# Patient Record
Sex: Male | Born: 1960 | Race: Black or African American | Hispanic: No | Marital: Single | Smoking: Current every day smoker
Health system: Southern US, Community
[De-identification: ages and names within clinical notes are randomized; demographics above are authoritative.]

## PROBLEM LIST (undated history)

## (undated) DIAGNOSIS — Z8739 Personal history of other diseases of the musculoskeletal system and connective tissue: Secondary | ICD-10-CM

## (undated) DIAGNOSIS — J45909 Unspecified asthma, uncomplicated: Secondary | ICD-10-CM

## (undated) DIAGNOSIS — M1990 Unspecified osteoarthritis, unspecified site: Secondary | ICD-10-CM

## (undated) HISTORY — PX: EYE SURGERY: SHX253

---

## 2007-06-14 ENCOUNTER — Emergency Department: Payer: Self-pay | Admitting: Emergency Medicine

## 2013-08-04 ENCOUNTER — Emergency Department: Payer: Self-pay | Admitting: Emergency Medicine

## 2013-08-25 ENCOUNTER — Ambulatory Visit: Payer: Self-pay | Admitting: Orthopedic Surgery

## 2014-01-18 ENCOUNTER — Emergency Department: Payer: Self-pay | Admitting: Emergency Medicine

## 2014-01-18 LAB — COMPREHENSIVE METABOLIC PANEL
ALK PHOS: 65 U/L
AST: 12 U/L — AB (ref 15–37)
Albumin: 4.3 g/dL (ref 3.4–5.0)
Anion Gap: 7 (ref 7–16)
BUN: 16 mg/dL (ref 7–18)
Bilirubin,Total: 0.6 mg/dL (ref 0.2–1.0)
Calcium, Total: 9.4 mg/dL (ref 8.5–10.1)
Chloride: 105 mmol/L (ref 98–107)
Co2: 25 mmol/L (ref 21–32)
Creatinine: 1.01 mg/dL (ref 0.60–1.30)
EGFR (African American): >60
EGFR (Non-African Amer.): >60
GLUCOSE: 124 mg/dL — AB (ref 65–99)
OSMOLALITY: 276 (ref 275–301)
POTASSIUM: 3.4 mmol/L — AB (ref 3.5–5.1)
SGPT (ALT): 28 U/L (ref 12–78)
SODIUM: 137 mmol/L (ref 136–145)
Total Protein: 7.8 g/dL (ref 6.4–8.2)

## 2014-01-18 LAB — CBC WITH DIFFERENTIAL/PLATELET
BASOS ABS: 0.1 10*3/uL (ref 0.0–0.1)
BASOS PCT: 0.4 %
EOS ABS: 0 10*3/uL (ref 0.0–0.7)
Eosinophil %: 0 %
HCT: 51.4 % (ref 40.0–52.0)
HGB: 16.8 g/dL (ref 13.0–18.0)
LYMPHS ABS: 0.2 10*3/uL — AB (ref 1.0–3.6)
Lymphocyte %: 1.4 %
MCH: 27.9 pg (ref 26.0–34.0)
MCHC: 32.7 g/dL (ref 32.0–36.0)
MCV: 86 fL (ref 80–100)
Monocyte #: 0.6 x10 3/mm (ref 0.2–1.0)
Monocyte %: 3.3 %
NEUTROS ABS: 16.1 10*3/uL — AB (ref 1.4–6.5)
Neutrophil %: 94.9 %
Platelet: 232 10*3/uL (ref 150–440)
RBC: 6.01 10*6/uL — ABNORMAL HIGH (ref 4.40–5.90)
RDW: 15.3 % — ABNORMAL HIGH (ref 11.5–14.5)
WBC: 17 10*3/uL — AB (ref 3.8–10.6)

## 2014-01-18 LAB — LIPASE, BLOOD: Lipase: 85 U/L (ref 73–393)

## 2014-01-18 LAB — TROPONIN I

## 2014-05-31 ENCOUNTER — Emergency Department: Payer: Self-pay | Admitting: Emergency Medicine

## 2014-05-31 LAB — URINALYSIS, COMPLETE
BILIRUBIN, UR: NEGATIVE
Blood: NEGATIVE
Glucose,UR: NEGATIVE mg/dL (ref 0–75)
Nitrite: NEGATIVE
PH: 7 (ref 4.5–8.0)
Protein: 30
RBC,UR: 2 /HPF (ref 0–5)
Specific Gravity: 1.027 (ref 1.003–1.030)
Squamous Epithelial: NONE SEEN

## 2014-05-31 LAB — CBC WITH DIFFERENTIAL/PLATELET
BASOS ABS: 0 10*3/uL (ref 0.0–0.1)
BASOS PCT: 0.1 %
EOS PCT: 0 %
Eosinophil #: 0 10*3/uL (ref 0.0–0.7)
HCT: 48.6 % (ref 40.0–52.0)
HGB: 16.1 g/dL (ref 13.0–18.0)
LYMPHS ABS: 1 10*3/uL (ref 1.0–3.6)
LYMPHS PCT: 7 %
MCH: 28.3 pg (ref 26.0–34.0)
MCHC: 33.2 g/dL (ref 32.0–36.0)
MCV: 85 fL (ref 80–100)
Monocyte #: 1.2 x10 3/mm — ABNORMAL HIGH (ref 0.2–1.0)
Monocyte %: 8.1 %
NEUTROS PCT: 84.8 %
Neutrophil #: 12.2 10*3/uL — ABNORMAL HIGH (ref 1.4–6.5)
Platelet: 241 10*3/uL (ref 150–440)
RBC: 5.68 10*6/uL (ref 4.40–5.90)
RDW: 14.9 % — ABNORMAL HIGH (ref 11.5–14.5)
WBC: 14.4 10*3/uL — ABNORMAL HIGH (ref 3.8–10.6)

## 2014-05-31 LAB — COMPREHENSIVE METABOLIC PANEL
ALBUMIN: 4.1 g/dL (ref 3.4–5.0)
AST: 32 U/L (ref 15–37)
Alkaline Phosphatase: 71 U/L
Anion Gap: 7 (ref 7–16)
BILIRUBIN TOTAL: 0.9 mg/dL (ref 0.2–1.0)
BUN: 8 mg/dL (ref 7–18)
CALCIUM: 9.8 mg/dL (ref 8.5–10.1)
CREATININE: 1.14 mg/dL (ref 0.60–1.30)
Chloride: 106 mmol/L (ref 98–107)
Co2: 25 mmol/L (ref 21–32)
GLUCOSE: 109 mg/dL — AB (ref 65–99)
Osmolality: 275 (ref 275–301)
Potassium: 4.2 mmol/L (ref 3.5–5.1)
SGPT (ALT): 31 U/L (ref 12–78)
SODIUM: 138 mmol/L (ref 136–145)
Total Protein: 7.6 g/dL (ref 6.4–8.2)

## 2014-05-31 LAB — VALPROIC ACID LEVEL: VALPROIC ACID: 4 ug/mL — AB

## 2014-10-15 ENCOUNTER — Emergency Department: Payer: Self-pay | Admitting: Internal Medicine

## 2014-10-15 LAB — COMPREHENSIVE METABOLIC PANEL
ALK PHOS: 60 U/L
Albumin: 4 g/dL (ref 3.4–5.0)
Anion Gap: 8 (ref 7–16)
BILIRUBIN TOTAL: 0.5 mg/dL (ref 0.2–1.0)
BUN: 13 mg/dL (ref 7–18)
CALCIUM: 8.9 mg/dL (ref 8.5–10.1)
CREATININE: 1.15 mg/dL (ref 0.60–1.30)
Chloride: 105 mmol/L (ref 98–107)
Co2: 27 mmol/L (ref 21–32)
EGFR (African American): >60
Glucose: 116 mg/dL — ABNORMAL HIGH (ref 65–99)
OSMOLALITY: 280 (ref 275–301)
Potassium: 4.3 mmol/L (ref 3.5–5.1)
SGOT(AST): 21 U/L (ref 15–37)
SGPT (ALT): 29 U/L
SODIUM: 140 mmol/L (ref 136–145)
Total Protein: 7.2 g/dL (ref 6.4–8.2)

## 2014-10-15 LAB — CBC
HCT: 46.3 % (ref 40.0–52.0)
HGB: 15.5 g/dL (ref 13.0–18.0)
MCH: 28.1 pg (ref 26.0–34.0)
MCHC: 33.4 g/dL (ref 32.0–36.0)
MCV: 84 fL (ref 80–100)
PLATELETS: 212 10*3/uL (ref 150–440)
RBC: 5.51 10*6/uL (ref 4.40–5.90)
RDW: 15.2 % — ABNORMAL HIGH (ref 11.5–14.5)
WBC: 10.6 10*3/uL (ref 3.8–10.6)

## 2014-10-15 LAB — ACETAMINOPHEN LEVEL: Acetaminophen: <2 ug/mL

## 2014-10-15 LAB — TROPONIN I: Troponin-I: <0.02 ng/mL

## 2014-10-15 LAB — SALICYLATE LEVEL: Salicylates, Serum: 2.5 mg/dL

## 2014-10-15 LAB — ETHANOL: Ethanol: 6 mg/dL

## 2016-06-04 ENCOUNTER — Telehealth: Payer: Self-pay | Admitting: *Deleted

## 2016-06-04 NOTE — Telephone Encounter (Signed)
lvm making the patient aware that I need insurance info in order to get his referral reviewed. i made him aware that the insurance that's given is stating ineligible..TD

## 2016-10-09 ENCOUNTER — Inpatient Hospital Stay: Payer: Medicaid Other

## 2016-10-09 ENCOUNTER — Inpatient Hospital Stay: Payer: Medicaid Other | Admitting: Certified Registered"

## 2016-10-09 ENCOUNTER — Inpatient Hospital Stay
Admission: EM | Admit: 2016-10-09 | Discharge: 2016-10-10 | DRG: 384 | Disposition: A | Discharge status: Home / Self Care | Payer: Medicaid Other | Attending: Internal Medicine | Admitting: Internal Medicine

## 2016-10-09 ENCOUNTER — Encounter
Admission: EM | Disposition: A | Discharge status: Home / Self Care | Payer: Self-pay | Source: Home / Self Care | Attending: Internal Medicine

## 2016-10-09 ENCOUNTER — Encounter: Payer: Self-pay | Admitting: Emergency Medicine

## 2016-10-09 DIAGNOSIS — Z23 Encounter for immunization: Secondary | ICD-10-CM

## 2016-10-09 DIAGNOSIS — F17210 Nicotine dependence, cigarettes, uncomplicated: Secondary | ICD-10-CM | POA: Diagnosis present

## 2016-10-09 DIAGNOSIS — R1084 Generalized abdominal pain: Secondary | ICD-10-CM | POA: Diagnosis present

## 2016-10-09 DIAGNOSIS — K269 Duodenal ulcer, unspecified as acute or chronic, without hemorrhage or perforation: Principal | ICD-10-CM | POA: Diagnosis present

## 2016-10-09 DIAGNOSIS — F419 Anxiety disorder, unspecified: Secondary | ICD-10-CM | POA: Diagnosis present

## 2016-10-09 DIAGNOSIS — R109 Unspecified abdominal pain: Secondary | ICD-10-CM

## 2016-10-09 DIAGNOSIS — K089 Disorder of teeth and supporting structures, unspecified: Secondary | ICD-10-CM | POA: Diagnosis present

## 2016-10-09 DIAGNOSIS — K209 Esophagitis, unspecified: Secondary | ICD-10-CM | POA: Diagnosis present

## 2016-10-09 DIAGNOSIS — R7989 Other specified abnormal findings of blood chemistry: Secondary | ICD-10-CM | POA: Diagnosis present

## 2016-10-09 DIAGNOSIS — M545 Low back pain: Secondary | ICD-10-CM | POA: Diagnosis present

## 2016-10-09 DIAGNOSIS — M1990 Unspecified osteoarthritis, unspecified site: Secondary | ICD-10-CM | POA: Diagnosis present

## 2016-10-09 DIAGNOSIS — N179 Acute kidney failure, unspecified: Secondary | ICD-10-CM | POA: Diagnosis present

## 2016-10-09 DIAGNOSIS — K922 Gastrointestinal hemorrhage, unspecified: Secondary | ICD-10-CM

## 2016-10-09 DIAGNOSIS — K920 Hematemesis: Secondary | ICD-10-CM | POA: Diagnosis present

## 2016-10-09 DIAGNOSIS — G4700 Insomnia, unspecified: Secondary | ICD-10-CM | POA: Diagnosis present

## 2016-10-09 HISTORY — DX: Unspecified osteoarthritis, unspecified site: M19.90

## 2016-10-09 HISTORY — PX: ESOPHAGOGASTRODUODENOSCOPY (EGD) WITH PROPOFOL: SHX5813

## 2016-10-09 LAB — COMPREHENSIVE METABOLIC PANEL
ALBUMIN: 4.3 g/dL (ref 3.5–5.0)
ALK PHOS: 60 U/L (ref 38–126)
ALT: 36 U/L (ref 17–63)
AST: 33 U/L (ref 15–41)
Anion gap: 13 (ref 5–15)
BUN: 18 mg/dL (ref 6–20)
CALCIUM: 9.5 mg/dL (ref 8.9–10.3)
CO2: 25 mmol/L (ref 22–32)
CREATININE: 1.29 mg/dL — AB (ref 0.61–1.24)
Chloride: 98 mmol/L — ABNORMAL LOW (ref 101–111)
GFR calc Af Amer: >60 mL/min (ref >60)
GFR calc non Af Amer: >60 mL/min (ref >60)
GLUCOSE: 115 mg/dL — AB (ref 65–99)
Potassium: 4 mmol/L (ref 3.5–5.1)
SODIUM: 136 mmol/L (ref 135–145)
Total Bilirubin: 1.6 mg/dL — ABNORMAL HIGH (ref 0.3–1.2)
Total Protein: 7.2 g/dL (ref 6.5–8.1)

## 2016-10-09 LAB — HEMOGLOBIN AND HEMATOCRIT, BLOOD
HCT: 48.9 % (ref 40.0–52.0)
HEMATOCRIT: 45.9 % (ref 40.0–52.0)
Hemoglobin: 16.5 g/dL (ref 13.0–18.0)
Hemoglobin: 15.3 g/dL (ref 13.0–18.0)

## 2016-10-09 LAB — TYPE AND SCREEN
ABO/RH(D): AB POS
Antibody Screen: NEGATIVE

## 2016-10-09 LAB — CBC
HCT: 47.3 % (ref 40.0–52.0)
HEMOGLOBIN: 16.2 g/dL (ref 13.0–18.0)
MCH: 27.5 pg (ref 26.0–34.0)
MCHC: 34.2 g/dL (ref 32.0–36.0)
MCV: 80.4 fL (ref 80.0–100.0)
PLATELETS: 260 10*3/uL (ref 150–440)
RBC: 5.88 MIL/uL (ref 4.40–5.90)
RDW: 15.5 % — ABNORMAL HIGH (ref 11.5–14.5)
WBC: 11.7 10*3/uL — ABNORMAL HIGH (ref 3.8–10.6)

## 2016-10-09 LAB — LIPASE, BLOOD: Lipase: 17 U/L (ref 11–51)

## 2016-10-09 SURGERY — ESOPHAGOGASTRODUODENOSCOPY (EGD) WITH PROPOFOL
Anesthesia: General

## 2016-10-09 MED ORDER — QUETIAPINE FUMARATE 300 MG PO TABS
600.0000 mg | ORAL_TABLET | Freq: Every day | ORAL | Status: DC
  Administered 2016-10-09: 600 mg via ORAL
  Filled 2016-10-09: qty 2

## 2016-10-09 MED ORDER — PANTOPRAZOLE SODIUM 40 MG IV SOLR
40.0000 mg | Freq: Once | INTRAVENOUS | Status: AC
  Administered 2016-10-09: 40 mg via INTRAVENOUS
  Filled 2016-10-09: qty 40

## 2016-10-09 MED ORDER — ONDANSETRON HCL 4 MG PO TABS: 4.0000 mg | ORAL_TABLET | Freq: Four times a day (QID) | ORAL | Status: DC | PRN

## 2016-10-09 MED ORDER — OXYCODONE HCL 5 MG PO TABS
5.0000 mg | ORAL_TABLET | ORAL | Status: DC | PRN
  Administered 2016-10-09 (×2): 5 mg via ORAL
  Filled 2016-10-09 (×2): qty 1

## 2016-10-09 MED ORDER — ONDANSETRON HCL 4 MG/2ML IJ SOLN: 4.0000 mg | Freq: Four times a day (QID) | INTRAMUSCULAR | Status: DC | PRN

## 2016-10-09 MED ORDER — PROPOFOL 500 MG/50ML IV EMUL
INTRAVENOUS | Status: DC | PRN
  Administered 2016-10-09: 120 ug/kg/min via INTRAVENOUS

## 2016-10-09 MED ORDER — SODIUM CHLORIDE 0.9% FLUSH
3.0000 mL | Freq: Two times a day (BID) | INTRAVENOUS | Status: DC
  Administered 2016-10-09 (×2): 3 mL via INTRAVENOUS

## 2016-10-09 MED ORDER — INFLUENZA VAC SPLIT QUAD 0.5 ML IM SUSY
0.5000 mL | PREFILLED_SYRINGE | INTRAMUSCULAR | Status: AC
  Administered 2016-10-10: 0.5 mL via INTRAMUSCULAR
  Filled 2016-10-09: qty 0.5

## 2016-10-09 MED ORDER — PNEUMOCOCCAL VAC POLYVALENT 25 MCG/0.5ML IJ INJ
0.5000 mL | INJECTION | INTRAMUSCULAR | Status: AC
  Administered 2016-10-10: 0.5 mL via INTRAMUSCULAR
  Filled 2016-10-09: qty 0.5

## 2016-10-09 MED ORDER — MIRTAZAPINE 15 MG PO TABS
45.0000 mg | ORAL_TABLET | Freq: Every day | ORAL | Status: DC
  Administered 2016-10-09: 45 mg via ORAL
  Filled 2016-10-09: qty 3

## 2016-10-09 MED ORDER — ACETAMINOPHEN 650 MG RE SUPP: 650.0000 mg | Freq: Four times a day (QID) | RECTAL | Status: DC | PRN

## 2016-10-09 MED ORDER — DEXTROSE-NACL 5-0.9 % IV SOLN
INTRAVENOUS | Status: DC
  Administered 2016-10-09 – 2016-10-10 (×2): via INTRAVENOUS

## 2016-10-09 MED ORDER — SODIUM CHLORIDE 0.9 % IV SOLN
INTRAVENOUS | Status: DC | PRN
  Administered 2016-10-09: 16:00:00 via INTRAVENOUS

## 2016-10-09 MED ORDER — LIDOCAINE 2% (20 MG/ML) 5 ML SYRINGE
INTRAMUSCULAR | Status: DC | PRN
  Administered 2016-10-09: 50 mg via INTRAVENOUS

## 2016-10-09 MED ORDER — GLYCOPYRROLATE 0.2 MG/ML IJ SOLN
INTRAMUSCULAR | Status: DC | PRN
  Administered 2016-10-09: 0.2 mg via INTRAVENOUS

## 2016-10-09 MED ORDER — ACETAMINOPHEN 325 MG PO TABS: 650.0000 mg | ORAL_TABLET | Freq: Four times a day (QID) | ORAL | Status: DC | PRN

## 2016-10-09 MED ORDER — PROPOFOL 10 MG/ML IV BOLUS
INTRAVENOUS | Status: DC | PRN
  Administered 2016-10-09: 70 mg via INTRAVENOUS
  Administered 2016-10-09: 30 mg via INTRAVENOUS

## 2016-10-09 MED ORDER — PANTOPRAZOLE SODIUM 40 MG IV SOLR
8.0000 mg/h | INTRAVENOUS | Status: DC
Start: 2016-10-09 — End: 2016-10-10
  Administered 2016-10-09 – 2016-10-10 (×2): 8 mg/h via INTRAVENOUS
  Filled 2016-10-09 (×3): qty 80

## 2016-10-09 MED ORDER — MORPHINE SULFATE (PF) 2 MG/ML IV SOLN
2.0000 mg | Freq: Four times a day (QID) | INTRAVENOUS | Status: DC | PRN
  Administered 2016-10-09: 2 mg via INTRAVENOUS
  Filled 2016-10-09: qty 1

## 2016-10-09 MED ORDER — MIDAZOLAM HCL 5 MG/5ML IJ SOLN
INTRAMUSCULAR | Status: DC | PRN
  Administered 2016-10-09: 1 mg via INTRAVENOUS

## 2016-10-09 MED ORDER — PANTOPRAZOLE SODIUM 40 MG IV SOLR
80.0000 mg | Freq: Once | INTRAVENOUS | Status: AC
  Administered 2016-10-09: 80 mg via INTRAVENOUS
  Filled 2016-10-09: qty 80

## 2016-10-09 MED ORDER — FENTANYL CITRATE (PF) 100 MCG/2ML IJ SOLN
INTRAMUSCULAR | Status: DC | PRN
  Administered 2016-10-09: 50 ug via INTRAVENOUS

## 2016-10-09 NOTE — ED Provider Notes (Signed)
[facility] Emergency Department Provider Note  ____________________________________________  Time seen: Approximately 11:33 AM  I have reviewed the triage vital signs and the nursing notes.   HISTORY  Chief Complaint Abdominal Pain    HPI Duane ClauseColonel R Berko Jr. is a 55 y.o. male who complains of abdominal pain for the past month which is generalized and constant. Waxing and waning. Also last night he started throwing up blood. He threw up blood again this morning for total 2 episodes. He is also noticed that his stool has been very dark recently. Does not take aspirin blood thinners or eat a lot of NSAIDs.No history of GI bleed     Past Medical History:  Diagnosis Date  . Arthritis      There are no active problems to display for this patient.    Past Surgical History:  Procedure Laterality Date  . EYE SURGERY       Prior to Admission medications   Not on File     Allergies Review of patient's allergies indicates no known allergies.   No family history on file.  Social History Social History  Substance Use Topics  . Smoking status: Current Some Day Smoker  . Smokeless tobacco: Never Used  . Alcohol use No    Review of Systems  Constitutional:   No fever or chills.  ENT:   No sore throat. No rhinorrhea. Cardiovascular:   No chest pain. Respiratory:   No dyspnea or cough. Gastrointestinal:   Positive as above for generalized abdominal pain and hematemesis.   10-point ROS otherwise negative.  ____________________________________________   PHYSICAL EXAM:  VITAL SIGNS: ED Triage Vitals  Enc Vitals Group     BP 10/09/16 0930 (!) 148/80     Pulse Rate 10/09/16 0936 65     Resp 10/09/16 0930 15     Temp 10/09/16 0936 98.4 F (36.9 C)     Temp Source 10/09/16 0936 Oral     SpO2 10/09/16 0936 99 %     Weight 10/09/16 0921 179 lb 3.7 oz (81.3 kg)     Height 10/09/16 0921 5\' 7"  (1.702 m)     Head Circumference --      Peak  Flow --      Pain Score 10/09/16 0922 10     Pain Loc --      Pain Edu? --      Excl. in GC? --     Vital signs reviewed, nursing assessments reviewed.   Constitutional:   Alert and oriented. Well appearing and in no distress. Eyes:   No scleral icterus. No conjunctival pallor. PERRL. EOMI.  No nystagmus. ENT   Head:   Normocephalic and atraumatic.   Nose:   No congestion/rhinnorhea. No septal hematoma   Mouth/Throat:   MMM, no pharyngeal erythema. No peritonsillar mass.    Neck:   No stridor. No SubQ emphysema. No meningismus. Hematological/Lymphatic/Immunilogical:   No cervical lymphadenopathy. Cardiovascular:   RRR. Symmetric bilateral radial and DP pulses.  No murmurs.  Respiratory:   Normal respiratory effort without tachypnea nor retractions. Breath sounds are clear and equal bilaterally. No wheezes/rales/rhonchi. Gastrointestinal:   Soft with epigastric tenderness. Non distended. There is no CVA tenderness.  No rebound, rigidity, or guarding. Rectal exam performed with nurse at the bedside. Dark stool, Hemoccult positive Musculoskeletal:   Nontender with normal range of motion in all extremities. No joint effusions.  No lower extremity tenderness.  No edema. Neurologic:   Normal speech and language.  CN 2-10 normal. Motor grossly intact. No gross focal neurologic deficits are appreciated.  Skin:    Skin is warm, dry and intact. No rash noted.  No petechiae, purpura, or bullae.  ____________________________________________    LABS (pertinent positives/negatives) (all labs ordered are listed, but only abnormal results are displayed) Labs Reviewed  COMPREHENSIVE METABOLIC PANEL - Abnormal; Notable for the following:       Result Value   Chloride 98 (*)    Glucose, Bld 115 (*)    Creatinine, Ser 1.29 (*)    Total Bilirubin 1.6 (*)    All other components within normal limits  CBC - Abnormal; Notable for the following:    WBC 11.7 (*)    RDW 15.5 (*)    All  other components within normal limits  LIPASE, BLOOD  POC OCCULT BLOOD, ED  TYPE AND SCREEN   ____________________________________________   EKG  Interpreted by me  Date: 10/09/2016  Rate: 75  Rhythm: normal sinus rhythm  QRS Axis: normal  Intervals: normal  ST/T Wave abnormalities: normal  Conduction Disutrbances: none  Narrative Interpretation: unremarkable      ____________________________________________    RADIOLOGY    ____________________________________________   PROCEDURES Procedures  ____________________________________________   INITIAL IMPRESSION / ASSESSMENT AND PLAN / ED COURSE  Pertinent labs & imaging results that were available during my care of the patient were reviewed by me and considered in my medical decision making (see chart for details).  Patient not in distress but presents with apparent upper GI bleed with hematemesis. Due to the chronicity and the presentation, I suspect peptic ulcer disease. No evidence of peritonitis or GI perforation at this time. Hemoglobin is stable, vital signs are stable. Case discussed with the hospitalists for further evaluation.     Clinical Course   ____________________________________________   FINAL CLINICAL IMPRESSION(S) / ED DIAGNOSES  Final diagnoses:  Generalized abdominal pain  Upper GI bleed       Portions of this note were generated with dragon dictation software. Dictation errors may occur despite best attempts at proofreading.    Sharman CheekPhillip Nathali Vent, MD 10/09/16 1136

## 2016-10-09 NOTE — Op Note (Signed)
[facility] Gastroenterology Patient Name: Duane DuboisColonel Solecki Procedure Date: 10/09/2016 4:14 PM MRN: [id number] Account #: [id number] Date of Birth: [birth year] Admit Type: Outpatient Age: [age] Room: [facility] ENDO ROOM 4 Gender: Male Note Status: Finalized Procedure:            Upper GI endoscopy Indications:          Epigastric abdominal pain, Periumbilical abdominal pain Providers:            Scot Junobert T. Elliott, MD Referring MD:         [facility] Medicines:            Propofol per Anesthesia Complications:        No immediate complications. Procedure:            Pre-Anesthesia Assessment:                       - After reviewing the risks and benefits, the patient                        was deemed in satisfactory condition to undergo the                        procedure.                       After obtaining informed consent, the endoscope was                        passed under direct vision. Throughout the procedure,                        the patient's blood pressure, pulse, and oxygen                        saturations were monitored continuously. The Endoscope                        was introduced through the mouth, and advanced to the                        second part of duodenum. The upper GI endoscopy was                        accomplished without difficulty. The patient tolerated                        the procedure well. Findings:      LA Grade B (one or more mucosal breaks greater than 5 mm, not extending       between the tops of two mucosal folds) esophagitis with no bleeding was       found 40 cm from the incisors.      Diffuse mild inflammation characterized by congestion (edema) and       erythema was found in the gastric body.      Five non-bleeding cratered and superficial duodenal ulcers with no       stigmata of bleeding were found in the duodenal bulb and in the second       portion of the duodenum. The largest lesion was 4 mm in  largest       dimension. Impression:           -  LA Grade B reflux esophagitis.                       - Gastritis.                       - Multiple non-bleeding duodenal ulcers with no                        stigmata of bleeding.                       - No specimens collected. Recommendation:       - The findings and recommendations were discussed with                        the patient's primary physician. IV PPI and carafate                        slurry, clear liquids, no carbonation, no coffee Scot Junobert T Elliott, MD 10/09/2016 4:44:16 PM This report has been signed electronically. Number of Addenda: 0 Note Initiated On: 10/09/2016 4:14 PM      [facility]

## 2016-10-09 NOTE — ED Notes (Signed)
Hospitalist at pt's bedside at this time for admit.

## 2016-10-09 NOTE — Anesthesia Preprocedure Evaluation (Signed)
Anesthesia Evaluation  Patient identified by MRN, date of birth, ID band Patient awake    Reviewed: Allergy & Precautions, NPO status , Patient's Chart, lab work & pertinent test results  History of Anesthesia Complications Negative for: history of anesthetic complications  Airway Mallampati: II       Dental  (+) Poor Dentition   Pulmonary Current Smoker,           Cardiovascular negative cardio ROS       Neuro/Psych negative neurological ROS     GI/Hepatic negative GI ROS, Neg liver ROS,   Endo/Other  negative endocrine ROS  Renal/GU negative Renal ROS     Musculoskeletal  (+) Arthritis , Osteoarthritis,    Abdominal   Peds  Hematology negative hematology ROS (+)   Anesthesia Other Findings   Reproductive/Obstetrics                             Anesthesia Physical Anesthesia Plan  ASA: II  Anesthesia Plan: General   Post-op Pain Management:    Induction: Intravenous  Airway Management Planned: Nasal Cannula  Additional Equipment:   Intra-op Plan:   Post-operative Plan:   Informed Consent: I have reviewed the patients History and Physical, chart, labs and discussed the procedure including the risks, benefits and alternatives for the proposed anesthesia with the patient or authorized representative who has indicated his/her understanding and acceptance.     Plan Discussed with:   Anesthesia Plan Comments:         Anesthesia Quick Evaluation

## 2016-10-09 NOTE — Progress Notes (Signed)
Family Meeting Note  Advance Directive:yes  Today a meeting took place with the Patient and sister over phone  Patient is able to participate  The following clinical team members were present during this meeting:MD  The following were discussed:Patient's diagnosis: , Patient's progosis: > 12 months and Goals for treatment: Continue present management  Full code, sister Jasmine DecemberSharon is the healthcare power of attorney  Additional follow-up to be provided: By hospitalist team and gastroenterology  Time spent during discussion:15 minutes  Shavon Ashmore, Deanna ArtisAruna, MD

## 2016-10-09 NOTE — Anesthesia Postprocedure Evaluation (Signed)
Anesthesia Post Note  Patient: Duane ClauseColonel R Kersten Jr.  Procedure(s) Performed: Procedure(s) (LRB): ESOPHAGOGASTRODUODENOSCOPY (EGD) WITH PROPOFOL (N/A)  Patient location during evaluation: Endoscopy Anesthesia Type: General Level of consciousness: awake and alert Pain management: pain level controlled Vital Signs Assessment: post-procedure vital signs reviewed and stable Respiratory status: spontaneous breathing and respiratory function stable Cardiovascular status: stable Anesthetic complications: no    Last Vitals:  Vitals:   10/09/16 1715 10/09/16 1733  BP: (!) 167/100 (!) 159/82  Pulse: 67 67  Resp: 17 16  Temp:  36.7 C    Last Pain:  Vitals:   10/09/16 1733  TempSrc: Oral  PainSc:                  [name] K

## 2016-10-09 NOTE — ED Notes (Signed)
Pt had pain in abdomen that began about 1 month ago. Pt stating that he vomited yesterday and noticed blood in is emesis. Pt also stating that he has noticed his stool being darker. Pt has been also taking Pepto-bismal. Per Marylene LandAngela, RN pt did have a positive occult of stool.

## 2016-10-09 NOTE — Consult Note (Signed)
GI Inpatient Consult Note  Reason for Consult: GI bleed   Attending Requesting Consult: Dr. Amado CoeGouru  History of Present Illness: Duane ClauseColonel R Haskett Jr. is a 55 y.o. male with a known history of arthritis admitted with a suspected GI bleed. Patient reports intermittent abdominal pain and "dark stools" over the last month or so.  Prior to the last month he experienced intermittent stomach discomfort, gas, and indigestion, but states "I thought this was just normal stuff".  He tried OTCs like Pepto-Bismol, Gas-X, and Prilosec without long-term improvement.  Regarding stools, patient reports "dark stools with red blood mixed in".  He continues to have a soft BM every 1-2 days w/o recent change.  Last night, abdominal pain increased significantly after eating dinner.  He became nauseous and experienced 2 episodes of dark emesis with bright red blood intermixed.   This morning, patient experienced another episode of dark emesis with BRB intermixed, so he called EMS.  He was transported to [facility] ED for further evaluation.  Upon arrival, vital signs are stable.  Labs demonstrated mild leukocytosis at 11.7, otherwise CBC unremarkable; this includes a normal Hgb of 16.2.  CMP notable for mildly elevated creatinine (1.29), BUN WNL.  Lipase and salicylate level also WNL.  However, black stool returned heme positive on rectal exam in the ED.  Patient was admitted for further evaluation and management, including initiation of IV Protonix drip and GI consultation.  Patient endorses a history of knee and low back pain, but denies use of NSAIDs, Goody's, BCs, etc.  He smokes about 1/2 pack of cigarettes daily, but denies EtOH use.  No prior abdominal surgeries.  No unexplained weight loss or appetite changes.  No dysphagia, heartburn, or acid reflux.  No recent change in bowel habits or frank blood in stool.  No personal h/o GI bleed.  Patient denies a family history of CRC, colon polyps, or other GI malignancy.  He has  never underwent an EGD or colonoscopy.  Past Medical History:  Past Medical History:  Diagnosis Date  . Arthritis     Problem List: Patient Active Problem List   Diagnosis Date Noted  . Hematemesis 10/09/2016    Past Surgical History: Past Surgical History:  Procedure Laterality Date  . EYE SURGERY      Allergies: No Known Allergies  Home Medications: Prescriptions Prior to Admission  Medication Sig Dispense Refill Last Dose  . mirtazapine (REMERON) 45 MG tablet Take 45 mg by mouth at bedtime.   10/08/2016 at 2000  . QUEtiapine (SEROQUEL) 300 MG tablet Take 600 mg by mouth at bedtime.   10/08/2016 at 2000   Home medication reconciliation was completed with the patient.   Scheduled Inpatient Medications:   . [START ON 10/10/2016] Influenza vac split quadrivalent PF  0.5 mL Intramuscular Tomorrow-1000  . mirtazapine  45 mg Oral QHS  . pantoprazole (PROTONIX) IVPB  80 mg Intravenous Once  . [START ON 10/10/2016] pneumococcal 23 valent vaccine  0.5 mL Intramuscular Tomorrow-1000  . QUEtiapine  600 mg Oral QHS  . sodium chloride flush  3 mL Intravenous Q12H    Continuous Inpatient Infusions:   . dextrose 5 % and 0.9% NaCl    . pantoprozole (PROTONIX) infusion      PRN Inpatient Medications:  acetaminophen **OR** acetaminophen, morphine injection, ondansetron **OR** ondansetron (ZOFRAN) IV, oxyCODONE  Family History: family history is not on file.   Social History:   reports that he has been smoking.  He has never used smokeless  tobacco. He reports that he does not drink alcohol.   Review of Systems: Constitutional: Weight is stable.  Eyes: No changes in vision. ENT: No oral lesions, sore throat.  GI: see HPI.  Heme/Lymph: No easy bruising.  CV: No chest pain.  GU: No hematuria.  Integumentary: No rashes.  Neuro: No headaches.  Psych: No depression/anxiety.  Endocrine: No heat/cold intolerance.  Allergic/Immunologic: No urticaria.  Resp: No cough, SOB.   Musculoskeletal: No joint swelling.    Physical Examination: BP (!) 161/77 (BP Location: Right Arm)   Pulse 60   Temp 98.2 F (36.8 C) (Oral)   Resp 18   Ht 5\' 7"  (1.702 m)   Wt 81.3 kg (179 lb 3.7 oz)   SpO2 98%   BMI 28.07 kg/m  Gen: NAD, alert and oriented x 4 HEENT: PEERLA, EOMI, Neck: supple, no JVD or thyromegaly Chest: CTA bilaterally, no wheezes, crackles, or other adventitious sounds CV: RRR, no m/g/c/r Abd: soft, moderate periumbilical tenderness > generalized abdominal pain, ND, +BS in all four quadrants; no HSM, guarding, ridigity, or rebound tenderness Ext: no edema, well perfused with 2+ pulses, Skin: no rash or lesions noted Lymph: no LAD  Data: Lab Results  Component Value Date   WBC 11.7 (H) 10/09/2016   HGB 16.2 10/09/2016   HCT 47.3 10/09/2016   MCV 80.4 10/09/2016   PLT 260 10/09/2016    Recent Labs Lab 10/09/16 0926  HGB 16.2   Lab Results  Component Value Date   NA 136 10/09/2016   K 4.0 10/09/2016   CL 98 (L) 10/09/2016   CO2 25 10/09/2016   BUN 18 10/09/2016   CREATININE 1.29 (H) 10/09/2016   Lab Results  Component Value Date   ALT 36 10/09/2016   AST 33 10/09/2016   ALKPHOS 60 10/09/2016   BILITOT 1.6 (H) 10/09/2016   No results for input(s): APTT, INR, PTT in the last 168 hours.   Assessment/Plan: Duane Randolph is a 55 y.o. male with a known history of arthritis admitted with a suspected GI bleed. Patient's primary complaints include indigestion, periumbilical pain, and "dark and bloody stools" intermittently over the last month.  He also notes "dark and bloody" emesis x 3 since last night.  Vitals and Hgb are quite stable.  Patient's most bothered by periumbilical pain at this time, so we will obtain a KUB flat/upright to begin evaluating this.  We will also plan for an EGD tomorrow pending Dr. Earnest ConroyElliott's evaluation of the patient.  Notably, patient's last KUB was obtained due to reported swallowing of a bag of cocaine; therefore, will  obtain a UDS as well.  Further recs pending patient's progress and per Dr. Mechele CollinElliott.  Recommendations: - KUB flat/upright, UDS today - EGD tomorrow pending Dr. Earnest ConroyElliott's evaluation - Continue symptomatic measures including PPI drip, pain control, and anti-emetics prn  Thank you for the consult. We will follow along with you. Please call with questions or concerns.  Burman FreestoneMichelle C Franki Stemen, PA-C [facility] Phone: [phone] Pager: [phone]

## 2016-10-09 NOTE — H&P (Signed)
[facility] [place] at [facility]   PATIENT NAME: Barton DuboisColonel Scalf    MR#:  [id number]  DATE OF BIRTH:  07-Aug-1961  DATE OF ADMISSION:  10/09/2016  PRIMARY CARE PHYSICIAN: [facility]   REQUESTING/REFERRING PHYSICIAN: STAFFORD  CHIEF COMPLAINT:  VOMITING BLOOD  HISTORY OF PRESENT ILLNESS:  Barton DuboisColonel Broman  is a 55 y.o. male with a known history of Arthritis is presenting to the emergency department with a chief complaint of epigastric abdominal pain and hematemesis. Patient has been noticing black tarry stool for the past one month and started having bloody vomits since last night. So far he had 2 episodes of hematemesis. Reporting epigastric abdominal pain 10 out of 10. Patient does not take over-the-counter Goody powders or NSAID's. No past medical history of GI bleed.Hemoglobin is at around 15  PAST MEDICAL HISTORY:   Past Medical History:  Diagnosis Date  . Arthritis     PAST SURGICAL HISTOIRY:   Past Surgical History:  Procedure Laterality Date  . EYE SURGERY      SOCIAL HISTORY:   Social History  Substance Use Topics  . Smoking status: Current Some Day Smoker  . Smokeless tobacco: Never Used  . Alcohol use No    FAMILY HISTORY:  No family history on file.  DRUG ALLERGIES:  No Known Allergies  REVIEW OF SYSTEMS:  CONSTITUTIONAL: No fever, fatigue or weakness.  EYES: No blurred or double vision.  EARS, NOSE, AND THROAT: No tinnitus or ear pain.  RESPIRATORY: No cough, shortness of breath, wheezing or hemoptysis.  CARDIOVASCULAR: No chest pain, orthopnea, edema.  GASTROINTESTINAL: Reporting epigastric abdominal pain and vomiting of blood. Also reports black tarry stool for a month GENITOURINARY: No dysuria, hematuria.  ENDOCRINE: No polyuria, nocturia,  HEMATOLOGY: No anemia, easy bruising or bleeding SKIN: No rash or lesion. MUSCULOSKELETAL: No joint pain or arthritis.   NEUROLOGIC: No tingling, numbness,  weakness.  PSYCHIATRY: No anxiety or depression.   MEDICATIONS AT HOME:   Prior to Admission medications   Medication Sig Start Date End Date Taking? Authorizing Provider  mirtazapine (REMERON) 45 MG tablet Take 45 mg by mouth at bedtime.   Yes Historical Provider, MD  QUEtiapine (SEROQUEL) 300 MG tablet Take 600 mg by mouth at bedtime.   Yes Historical Provider, MD      VITAL SIGNS:  Blood pressure (!) 142/72, pulse 64, temperature 98.4 F (36.9 C), temperature source Oral, resp. rate 19, height 5\' 7"  (1.702 m), weight 81.3 kg (179 lb 3.7 oz), SpO2 98 %.  PHYSICAL EXAMINATION:  GENERAL:  55 y.o.-year-old patient lying in the bed with no acute distress.  EYES: Pupils equal, round, reactive to light and accommodation. No scleral icterus. Extraocular muscles intact.  HEENT: Head atraumatic, normocephalic. Oropharynx and nasopharynx clear.  NECK:  Supple, no jugular venous distention. No thyroid enlargement, no tenderness.  LUNGS: Normal breath sounds bilaterally, no wheezing, rales,rhonchi or crepitation. No use of accessory muscles of respiration.  CARDIOVASCULAR: S1, S2 normal. No murmurs, rubs, or gallops.  ABDOMEN: Soft, Positive epigastric tenderness but no rebound tenderness, nondistended. Bowel sounds present. No organomegaly or mass.  EXTREMITIES: No pedal edema, cyanosis, or clubbing.  NEUROLOGIC: Cranial nerves II through XII are intact. Muscle strength 5/5 in all extremities. Sensation intact. Gait not checked.  PSYCHIATRIC: The patient is alert and oriented x 3.  SKIN: No obvious rash, lesion, or ulcer.   LABORATORY PANEL:   CBC  Recent Labs Lab 10/09/16 0926  WBC 11.7*  HGB 16.2  HCT 47.3  PLT 260   ------------------------------------------------------------------------------------------------------------------  Chemistries   Recent Labs Lab 10/09/16 0926  NA 136  K 4.0  CL 98*  CO2 25  GLUCOSE 115*  BUN 18  CREATININE 1.29*  CALCIUM 9.5  AST 33   ALT 36  ALKPHOS 60  BILITOT 1.6*   ------------------------------------------------------------------------------------------------------------------  Cardiac Enzymes No results for input(s): TROPONINI in the last 168 hours. ------------------------------------------------------------------------------------------------------------------  RADIOLOGY:  No results found.  EKG:   Orders placed or performed during the hospital encounter of 10/09/16  . EKG 12-Lead  . EKG 12-Lead    IMPRESSION AND PLAN:   Barton DuboisColonel Nhan  is a 55 y.o. male with a known history of Arthritis is presenting to the emergency department with a chief complaint of epigastric abdominal pain and hematemesis. Patient has been noticing black tarry stool for the past one month and started having bloody vomits since last night. So far he had 2 episodes of hematemesis. Reporting epigastric abdominal pain 10 out of 10.  # Acute GI bleed and hematemesis and reporting black tarry stool-Could be from peptic ulcer disease Admit to MedSurg unit Nothing by mouth Provide IV fluids Started patient on Protonix drip Monitor hemoglobin and hematocrit closely and transfuse as needed GI consult is placed Patient's stool for Hemoccult was positive in the emergency department  #Acute kidney injury-Prerenal from vomiting Provide IV fluids Avoid nephrotoxins Monitor renal function closely  #Epigastric abdominal pain secondary to Possible peptic ulcer disease Pain medication as needed basis PPI drip  #Insomnia And anxiety Continue Seroquel home medication  DVT prophylaxis with SCDs   All the records are reviewed and case discussed with ED provider. Management plans discussed with the patient, Sister and they are in agreement.  CODE STATUS: FC  TOTAL TIME TAKING CARE OF THIS PATIENT: 45 minutes.   Note: This dictation was prepared with Dragon dictation along with smaller phrase technology. Any transcriptional errors  that result from this process are unintentional.  Ramonita LabGouru, Quintyn Dombek M.D on 10/09/2016 at 12:21 PM  Between 7am to 6pm - Pager - [phone]  After 6pm go to [email or web address] - password [email or web address] [facility]  [facility]  Office  [phone]  CC: Primary care physician; [facility]

## 2016-10-09 NOTE — ED Triage Notes (Signed)
[facility] for abdominal pain x 1 month. Patient reports that last night he had an episode of vomiting that appeared to have blood in it and also noted that his stool has been darker than normal. Patient also had 1 episode of vomiting this morning.

## 2016-10-09 NOTE — Consult Note (Signed)
Patient with abd pain and bleeding had multiple duodenal ulcers on EGD.  Some local mild esophagitis at GEJ.  Recommend carafate slurry and continue PPI, prn anti-nausea medicine.  Advance diet very slowly.  Should have colonoscopy and repeat EGD in few months.

## 2016-10-09 NOTE — Transfer of Care (Signed)
Immediate Anesthesia Transfer of Care Note  Patient: Mertie ClauseColonel R Snuffer Jr.  Procedure(s) Performed: Procedure(s): ESOPHAGOGASTRODUODENOSCOPY (EGD) WITH PROPOFOL (N/A)  Patient Location: Endoscopy Unit  Anesthesia Type:General  Level of Consciousness: awake  Airway & Oxygen Therapy: Patient Spontanous Breathing and Patient connected to nasal cannula oxygen  Post-op Assessment: Report given to RN and Post -op Vital signs reviewed and stable  Post vital signs: Reviewed  Last Vitals:  Vitals:   10/09/16 1644 10/09/16 1645  BP: 140/86 140/86  Pulse: 83 83  Resp: 16 15  Temp: 36.1 C 36.1 C    Last Pain:  Vitals:   10/09/16 1645  TempSrc: Tympanic  PainSc: Asleep      Patients Stated Pain Goal: 10 (10/09/16 1604)  Complications: No apparent anesthesia complications

## 2016-10-09 NOTE — Progress Notes (Signed)
Patient made a request to talk with a chaplain. When chaplain on call received the consult he responded. Patient shared with chaplain how he had issues with vomiting blood, which scared him and he called 911. Patient said that vomiting blood stopped, but his stomach was still hurt. Chaplain prayed with patient after having a long conversation.

## 2016-10-09 NOTE — ED Notes (Signed)
Pt notified that we are just waiting on his room being available. Pt is currently on his phone with his significant other and in NAD at this time.

## 2016-10-10 LAB — CBC
HCT: 42.2 % (ref 40.0–52.0)
Hemoglobin: 14.3 g/dL (ref 13.0–18.0)
MCH: 27.6 pg (ref 26.0–34.0)
MCHC: 34 g/dL (ref 32.0–36.0)
MCV: 81.2 fL (ref 80.0–100.0)
PLATELETS: 198 10*3/uL (ref 150–440)
RBC: 5.19 MIL/uL (ref 4.40–5.90)
RDW: 15.4 % — AB (ref 11.5–14.5)
WBC: 5 10*3/uL (ref 3.8–10.6)

## 2016-10-10 LAB — PROTIME-INR
INR: 1.01
PROTHROMBIN TIME: 13.3 s (ref 11.4–15.2)

## 2016-10-10 LAB — COMPREHENSIVE METABOLIC PANEL
ALBUMIN: 3.3 g/dL — AB (ref 3.5–5.0)
ALK PHOS: 48 U/L (ref 38–126)
ALT: 43 U/L (ref 17–63)
ANION GAP: 4 — AB (ref 5–15)
AST: 44 U/L — ABNORMAL HIGH (ref 15–41)
BILIRUBIN TOTAL: 0.9 mg/dL (ref 0.3–1.2)
BUN: 8 mg/dL (ref 6–20)
CALCIUM: 8.7 mg/dL — AB (ref 8.9–10.3)
CO2: 26 mmol/L (ref 22–32)
Chloride: 111 mmol/L (ref 101–111)
Creatinine, Ser: 0.94 mg/dL (ref 0.61–1.24)
GLUCOSE: 142 mg/dL — AB (ref 65–99)
POTASSIUM: 3.8 mmol/L (ref 3.5–5.1)
Sodium: 141 mmol/L (ref 135–145)
TOTAL PROTEIN: 5.4 g/dL — AB (ref 6.5–8.1)

## 2016-10-10 LAB — HEMOGLOBIN AND HEMATOCRIT, BLOOD
HCT: 43.3 % (ref 40.0–52.0)
HEMOGLOBIN: 14.3 g/dL (ref 13.0–18.0)

## 2016-10-10 LAB — URINE DRUG SCREEN, QUALITATIVE (ARMC ONLY)
AMPHETAMINES, UR SCREEN: NOT DETECTED
BENZODIAZEPINE, UR SCRN: POSITIVE — AB
Barbiturates, Ur Screen: NOT DETECTED
Cannabinoid 50 Ng, Ur ~~LOC~~: POSITIVE — AB
Cocaine Metabolite,Ur ~~LOC~~: POSITIVE — AB
MDMA (ECSTASY) UR SCREEN: NOT DETECTED
METHADONE SCREEN, URINE: NOT DETECTED
Opiate, Ur Screen: POSITIVE — AB
Phencyclidine (PCP) Ur S: NOT DETECTED
TRICYCLIC, UR SCREEN: POSITIVE — AB

## 2016-10-10 MED ORDER — SUCRALFATE 1 GM/10ML PO SUSP
1.0000 g | Freq: Three times a day (TID) | ORAL | Status: DC
  Filled 2016-10-10 (×3): qty 10

## 2016-10-10 MED ORDER — PANTOPRAZOLE SODIUM 40 MG PO TBEC: 40.0000 mg | DELAYED_RELEASE_TABLET | Freq: Two times a day (BID) | ORAL | 0 refills | Status: DC

## 2016-10-10 MED ORDER — SUCRALFATE 1 GM/10ML PO SUSP: 1.0000 g | Freq: Three times a day (TID) | ORAL | 0 refills | Status: DC

## 2016-10-10 NOTE — Progress Notes (Signed)
10/10/2016 12:17 PM  Duane Theola Sequin Garverick Jr. to be D/C'd Home per MD order.  Discussed prescriptions and follow up appointments with the patient. Prescriptions given to patient, medication list explained in detail. Pt verbalized understanding.    Medication List    TAKE these medications   mirtazapine 45 MG tablet Commonly known as:  REMERON Take 45 mg by mouth at bedtime.   pantoprazole 40 MG tablet Commonly known as:  PROTONIX Take 1 tablet (40 mg total) by mouth 2 (two) times daily.   QUEtiapine 300 MG tablet Commonly known as:  SEROQUEL Take 600 mg by mouth at bedtime.   sucralfate 1 GM/10ML suspension Commonly known as:  CARAFATE Take 10 mLs (1 g total) by mouth 4 (four) times daily -  with meals and at bedtime.       Vitals:   10/09/16 2124 10/10/16 0456  BP: 136/71 (!) 117/58  Pulse: (!) 59 (!) 56  Resp: 16 17  Temp: 98.4 F (36.9 C) 97.7 F (36.5 C)    Skin clean, dry and intact without evidence of skin break down, no evidence of skin tears noted. IV catheter discontinued intact. Site without signs and symptoms of complications. Dressing and pressure applied. Pt denies pain at this time. No complaints noted.  An After Visit Summary was printed and given to the patient. Patient escorted via WC, and D/C home via private auto.  Duane Randolph, Bonifacio Pruden E

## 2016-10-10 NOTE — Discharge Summary (Signed)
[facility] [place] at [facility]   PATIENT NAME: Duane DuboisColonel Randolph    MR#:  [id number]  DATE OF BIRTH:  05/25/1961  DATE OF ADMISSION:  10/09/2016 ADMITTING PHYSICIAN: Ramonita LabAruna Gouru, MD  DATE OF DISCHARGE: 10/10/2016  PRIMARY CARE PHYSICIAN: [facility]    ADMISSION DIAGNOSIS:  Generalized abdominal pain [R10.84] Upper GI bleed [K92.2]  DISCHARGE DIAGNOSIS:  Active Problems:   Hematemesis   SECONDARY DIAGNOSIS:   Past Medical History:  Diagnosis Date  . Arthritis     HOSPITAL COURSE:    55 y.o. male with a known history of Arthritis Presented to the emergency department with a chief complaint of epigastric abdominal pain and hematemesis.  1. Upper GI bleed: Patient was evaluated by gastroenterology. He underwent EGD. EGD shows multiple duodenal ulcers with some local mild esophagitis at JE junction patient is discharged on Carafate and PPI. He will be on a clear liquid diet for 3 days then of very soft diet for 3 days then regular diet. He will follow-up in one month and will eventually need a repeat EGD and colonoscopy. He was advised not to take any NSAIDs or drinking EtOH. Hemoglobin remained stable and patient did not require blood transfusion 2. Acute kidney injury in the setting of prerenal azotemia: This is improved with IV fluids 3. Abdominal pain: This is due to duodenal ulcers.  4. Psych: Patient may continue his outpatient medications  DISCHARGE CONDITIONS AND DIET:   Stable for discharge on clear liquid diet for 3 days then very soft diet  CONSULTS OBTAINED:    DRUG ALLERGIES:  No Known Allergies  DISCHARGE MEDICATIONS:   Current Discharge Medication List    START taking these medications   Details  pantoprazole (PROTONIX) 40 MG tablet Take 1 tablet (40 mg total) by mouth 2 (two) times daily. Qty: 60 tablet, Refills: 0    sucralfate (CARAFATE) 1 GM/10ML suspension Take 10 mLs (1 g total) by mouth 4 (four) times  daily -  with meals and at bedtime. Qty: 420 mL, Refills: 0      CONTINUE these medications which have NOT CHANGED   Details  mirtazapine (REMERON) 45 MG tablet Take 45 mg by mouth at bedtime.    QUEtiapine (SEROQUEL) 300 MG tablet Take 600 mg by mouth at bedtime.              Today   CHIEF COMPLAINT:  Patient doing well this morning denies melena or hematemesis   VITAL SIGNS:  Blood pressure (!) 117/58, pulse (!) 56, temperature 97.7 F (36.5 C), temperature source Oral, resp. rate 17, height 5\' 7"  (1.702 m), weight 81.3 kg (179 lb 3.7 oz), SpO2 100 %.   REVIEW OF SYSTEMS:  Review of Systems  Constitutional: Negative.  Negative for chills, fever and malaise/fatigue.  HENT: Negative.  Negative for ear discharge, ear pain, hearing loss, nosebleeds and sore throat.   Eyes: Negative.  Negative for blurred vision and pain.  Respiratory: Negative.  Negative for cough, hemoptysis, shortness of breath and wheezing.   Cardiovascular: Negative.  Negative for chest pain, palpitations and leg swelling.  Gastrointestinal: Positive for abdominal pain. Negative for blood in stool, diarrhea, nausea and vomiting.  Genitourinary: Negative.  Negative for dysuria.  Musculoskeletal: Negative.  Negative for back pain.  Skin: Negative.   Neurological: Negative for dizziness, tremors, speech change, focal weakness, seizures and headaches.  Endo/Heme/Allergies: Negative.  Does not bruise/bleed easily.  Psychiatric/Behavioral: Negative.  Negative for depression, hallucinations and suicidal  ideas.     PHYSICAL EXAMINATION:  GENERAL:  55 y.o.-year-old patient lying in the bed with no acute distress.  NECK:  Supple, no jugular venous distention. No thyroid enlargement, no tenderness.  LUNGS: Normal breath sounds bilaterally, no wheezing, rales,rhonchi  No use of accessory muscles of respiration.  CARDIOVASCULAR: S1, S2 normal. No murmurs, rubs, or gallops.  ABDOMEN: Soft, non-tender,  non-distended. Bowel sounds present. No organomegaly or mass.  EXTREMITIES: No pedal edema, cyanosis, or clubbing.  PSYCHIATRIC: The patient is alert and oriented x 3.  SKIN: No obvious rash, lesion, or ulcer.   DATA REVIEW:   CBC  Recent Labs Lab 10/10/16 0606  WBC 5.0  HGB 14.3  HCT 42.2  PLT 198    Chemistries   Recent Labs Lab 10/10/16 0606  NA 141  K 3.8  CL 111  CO2 26  GLUCOSE 142*  BUN 8  CREATININE 0.94  CALCIUM 8.7*  AST 44*  ALT 43  ALKPHOS 48  BILITOT 0.9    Cardiac Enzymes No results for input(s): TROPONINI in the last 168 hours.  Microbiology Results  @[email or web address] @  RADIOLOGY:  Dg Abd 2 Views  Result Date: 10/09/2016 CLINICAL DATA:  Acute onset of generalized abdominal pain and hematemesis. Initial encounter. EXAM: ABDOMEN - 2 VIEW COMPARISON:  Abdominal radiograph performed 10/15/2014 FINDINGS: The visualized bowel gas pattern is unremarkable. Scattered air and stool filled loops of colon are seen; no abnormal dilatation of small bowel loops is seen to suggest small bowel obstruction. No free intra-abdominal air is identified on the provided upright view. The stomach is partially filled with air and fluid. The visualized osseous structures are within normal limits; the sacroiliac joints are unremarkable in appearance. Minimal left basilar atelectasis is noted. IMPRESSION: Unremarkable bowel gas pattern; no free intra-abdominal air seen. Small amount of stool noted in the colon. Electronically Signed   By: Roanna RaiderJeffery  Chang M.D.   On: 10/09/2016 18:30      Management plans discussed with the patient and he is in agreement. Stable for discharge home Rounded with nursing staff  Patient should follow up with GI in 4 weeks and PCP in one week  CODE STATUS:     Code Status Orders        Start     Ordered   10/09/16 1328  Full code  Continuous     10/09/16 1327    Code Status History    Date Active Date Inactive Code Status Order ID Comments  User Context   This patient has a current code status but no historical code status.      TOTAL TIME TAKING CARE OF THIS PATIENT: 36 minutes.    Note: This dictation was prepared with Dragon dictation along with smaller phrase technology. Any transcriptional errors that result from this process are unintentional.  Lovette Merta M.D on 10/10/2016 at 9:20 AM  Between 7am to 6pm - Pager - [phone] After 6pm go to [email or web address] - Social research officer, governmentpassword EPAS [facility]  [facility] [place] [facility] [place] [facility]  Office  [phone]  CC: Primary care physician; [facility]

## 2016-10-12 ENCOUNTER — Encounter: Payer: Self-pay | Admitting: Unknown Physician Specialty

## 2017-02-24 ENCOUNTER — Emergency Department
Admission: EM | Admit: 2017-02-24 | Discharge: 2017-02-24 | Disposition: A | Discharge status: Home / Self Care | Payer: Medicaid Other | Attending: Emergency Medicine | Admitting: Emergency Medicine

## 2017-02-24 ENCOUNTER — Encounter: Payer: Self-pay | Admitting: Emergency Medicine

## 2017-02-24 DIAGNOSIS — Z5321 Procedure and treatment not carried out due to patient leaving prior to being seen by health care provider: Secondary | ICD-10-CM | POA: Diagnosis not present

## 2017-02-24 DIAGNOSIS — F17200 Nicotine dependence, unspecified, uncomplicated: Secondary | ICD-10-CM | POA: Insufficient documentation

## 2017-02-24 DIAGNOSIS — R1013 Epigastric pain: Secondary | ICD-10-CM | POA: Diagnosis not present

## 2017-02-24 LAB — LIPASE, BLOOD: Lipase: 19 U/L (ref 11–51)

## 2017-02-24 LAB — URINALYSIS, COMPLETE (UACMP) WITH MICROSCOPIC
BILIRUBIN URINE: NEGATIVE
Bacteria, UA: NONE SEEN
GLUCOSE, UA: NEGATIVE mg/dL
Hgb urine dipstick: NEGATIVE
KETONES UR: NEGATIVE mg/dL
LEUKOCYTES UA: NEGATIVE
Nitrite: NEGATIVE
PH: 6 (ref 5.0–8.0)
PROTEIN: NEGATIVE mg/dL
RBC / HPF: NONE SEEN RBC/hpf (ref 0–5)
SQUAMOUS EPITHELIAL / LPF: NONE SEEN
Specific Gravity, Urine: 1.013 (ref 1.005–1.030)

## 2017-02-24 LAB — COMPREHENSIVE METABOLIC PANEL
ALT: 32 U/L (ref 17–63)
ANION GAP: 6 (ref 5–15)
AST: 34 U/L (ref 15–41)
Albumin: 4.4 g/dL (ref 3.5–5.0)
Alkaline Phosphatase: 61 U/L (ref 38–126)
BUN: 19 mg/dL (ref 6–20)
CHLORIDE: 106 mmol/L (ref 101–111)
CO2: 24 mmol/L (ref 22–32)
Calcium: 9.3 mg/dL (ref 8.9–10.3)
Creatinine, Ser: 1.23 mg/dL (ref 0.61–1.24)
Glucose, Bld: 124 mg/dL — ABNORMAL HIGH (ref 65–99)
POTASSIUM: 4.1 mmol/L (ref 3.5–5.1)
Sodium: 136 mmol/L (ref 135–145)
Total Bilirubin: 0.6 mg/dL (ref 0.3–1.2)
Total Protein: 7.3 g/dL (ref 6.5–8.1)

## 2017-02-24 LAB — TROPONIN I: Troponin I: <0.03 ng/mL (ref <0.03)

## 2017-02-24 LAB — CBC
HCT: 46.3 % (ref 40.0–52.0)
HEMOGLOBIN: 15.6 g/dL (ref 13.0–18.0)
MCH: 27 pg (ref 26.0–34.0)
MCHC: 33.6 g/dL (ref 32.0–36.0)
MCV: 80.3 fL (ref 80.0–100.0)
Platelets: 274 10*3/uL (ref 150–440)
RBC: 5.77 MIL/uL (ref 4.40–5.90)
RDW: 15.5 % — ABNORMAL HIGH (ref 11.5–14.5)
WBC: 8.2 10*3/uL (ref 3.8–10.6)

## 2017-02-24 MED ORDER — ONDANSETRON 4 MG PO TBDP
4.0000 mg | ORAL_TABLET | Freq: Once | ORAL | Status: AC | PRN
  Administered 2017-02-24: 4 mg via ORAL
  Filled 2017-02-24: qty 1

## 2017-02-24 NOTE — ED Triage Notes (Signed)
Pt to triage in wheelchair due to abdominal pain. Pt reports N/V, epigastric abdominal pain, blood in stool and emesis since AM. Pt has HX of bleeding ulcers. Pt sts "I would like to get checked for the AIDS and the flu while im here as well."

## 2017-02-26 ENCOUNTER — Telehealth: Payer: Self-pay | Admitting: Emergency Medicine

## 2017-02-26 NOTE — Telephone Encounter (Signed)
Called patient due to lwot to inquire about condition and follow up plans. Explained that patient needs to be seen by provider to have abdominal exam.  Says no further vomiting, but still has some stomach discomfort.  I told him to either go to pcp or here for exam and let his pcp know about labs done here.  Pt agrees.

## 2017-12-08 ENCOUNTER — Emergency Department
Admission: EM | Admit: 2017-12-08 | Discharge: 2017-12-09 | Disposition: A | Discharge status: Other Acute Inpatient Hospital | Payer: Medicaid Other | Attending: Emergency Medicine | Admitting: Emergency Medicine

## 2017-12-08 ENCOUNTER — Encounter: Payer: Self-pay | Admitting: Emergency Medicine

## 2017-12-08 DIAGNOSIS — F1911 Other psychoactive substance abuse, in remission: Secondary | ICD-10-CM

## 2017-12-08 DIAGNOSIS — F17200 Nicotine dependence, unspecified, uncomplicated: Secondary | ICD-10-CM | POA: Diagnosis not present

## 2017-12-08 DIAGNOSIS — R45851 Suicidal ideations: Secondary | ICD-10-CM | POA: Diagnosis not present

## 2017-12-08 DIAGNOSIS — F322 Major depressive disorder, single episode, severe without psychotic features: Secondary | ICD-10-CM

## 2017-12-08 DIAGNOSIS — Z634 Disappearance and death of family member: Secondary | ICD-10-CM

## 2017-12-08 DIAGNOSIS — F4321 Adjustment disorder with depressed mood: Secondary | ICD-10-CM

## 2017-12-08 DIAGNOSIS — K219 Gastro-esophageal reflux disease without esophagitis: Secondary | ICD-10-CM

## 2017-12-08 DIAGNOSIS — Z87898 Personal history of other specified conditions: Secondary | ICD-10-CM | POA: Insufficient documentation

## 2017-12-08 DIAGNOSIS — F329 Major depressive disorder, single episode, unspecified: Secondary | ICD-10-CM | POA: Diagnosis present

## 2017-12-08 MED ORDER — SUCRALFATE 1 GM/10ML PO SUSP
1.0000 g | Freq: Three times a day (TID) | ORAL | Status: DC
  Administered 2017-12-09: 1 g via ORAL
  Filled 2017-12-08 (×7): qty 10

## 2017-12-08 MED ORDER — PANTOPRAZOLE SODIUM 40 MG PO TBEC
40.0000 mg | DELAYED_RELEASE_TABLET | Freq: Two times a day (BID) | ORAL | Status: DC
  Administered 2017-12-08 – 2017-12-09 (×2): 40 mg via ORAL
  Filled 2017-12-08 (×2): qty 1

## 2017-12-08 MED ORDER — MIRTAZAPINE 15 MG PO TABS
45.0000 mg | ORAL_TABLET | Freq: Every day | ORAL | Status: DC
  Administered 2017-12-08: 45 mg via ORAL
  Filled 2017-12-08: qty 3

## 2017-12-08 MED ORDER — FAMOTIDINE 20 MG PO TABS
20.0000 mg | ORAL_TABLET | Freq: Two times a day (BID) | ORAL | Status: DC
  Administered 2017-12-09: 20 mg via ORAL
  Filled 2017-12-08: qty 1

## 2017-12-08 MED ORDER — QUETIAPINE FUMARATE 200 MG PO TABS
400.0000 mg | ORAL_TABLET | Freq: Every day | ORAL | Status: DC
  Administered 2017-12-08: 400 mg via ORAL
  Filled 2017-12-08: qty 2

## 2017-12-08 MED ORDER — FAMOTIDINE 20 MG PO TABS
ORAL_TABLET | ORAL | Status: AC
Start: 2017-12-08 — End: 2017-12-09
  Filled 2017-12-08: qty 1

## 2017-12-08 NOTE — ED Notes (Signed)
Patient is aware of need to provide urine sample.

## 2017-12-08 NOTE — ED Provider Notes (Addendum)
[facility] Emergency Department Provider Note  ____________________________________________  Time seen: Approximately 12:12 PM  I have reviewed the triage vital signs and the nursing notes.   HISTORY  Chief Complaint No chief complaint on file.    HPI Duane Randolph. is  a 57 y.o. male for the past couple of weeks after the death of his newborn child. His wife is a history of substance abuse, was induced at [facility] in mid December. Unfortunately, the newborn was in critical care and so certainly died within a day or 2 of birth. He reports not taking his medications for the past 4 days because he's been too upset. Difficulty sleeping. Decreased appetite. No SI or HI or hallucinations. He wants resources for grief counseling.     Past Medical History:  Diagnosis Date  . Arthritis      Patient Active Problem List   Diagnosis Date Noted  . Hematemesis 10/09/2016     Past Surgical History:  Procedure Laterality Date  . ESOPHAGOGASTRODUODENOSCOPY (EGD) WITH PROPOFOL N/A 10/09/2016   Procedure: ESOPHAGOGASTRODUODENOSCOPY (EGD) WITH PROPOFOL;  Surgeon: Scot Jun, MD;  Location: [facility] ENDOSCOPY;  Service: Endoscopy;  Laterality: N/A;  . EYE SURGERY       Prior to Admission medications   Medication Sig Start Date End Date Taking? Authorizing Provider  mirtazapine (REMERON) 45 MG tablet Take 45 mg by mouth at bedtime.    [provider]  pantoprazole (PROTONIX) 40 MG tablet Take 1 tablet (40 mg total) by mouth 2 (two) times daily. 10/10/16 11/09/16  Adrian Saran, MD  QUEtiapine (SEROQUEL) 300 MG tablet Take 600 mg by mouth at bedtime.    [provider]  sucralfate (CARAFATE) 1 GM/10ML suspension Take 10 mLs (1 g total) by mouth 4 (four) times daily -  with meals and at bedtime. 10/10/16   Adrian Saran, MD     Allergies Patient has no known allergies.   History reviewed. No pertinent family history.  Social History Social  History   Tobacco Use  . Smoking status: Current Some Day Smoker  . Smokeless tobacco: Never Used  Substance Use Topics  . Alcohol use: No  . Drug use: Not on file    Review of Systems  Constitutional:   No fever or chills.  ENT:   No sore throat. No rhinorrhea. Cardiovascular:   No chest pain or syncope. Respiratory:   No dyspnea or cough. Gastrointestinal:   Negative for abdominal pain, vomiting and diarrhea.  Musculoskeletal:   Bilateral feet pain after walking many miles to get to the hospital today All other systems reviewed and are negative except as documented above in ROS and HPI.  ____________________________________________   PHYSICAL EXAM:  VITAL SIGNS: ED Triage Vitals  Enc Vitals Group     BP 12/08/17 0857 (!) 181/102     Pulse Rate 12/08/17 0857 90     Resp 12/08/17 0857 18     Temp 12/08/17 0857 97.6 F (36.4 C)     Temp Source 12/08/17 0857 Oral     SpO2 12/08/17 0857 97 %     Weight 12/08/17 0858 150 lb (68 kg)     Height 12/08/17 0858 5\' 7"  (1.702 m)     Head Circumference --      Peak Flow --      Pain Score --      Pain Loc --      Pain Edu? --      Excl. in GC? --  Vital signs reviewed, nursing assessments reviewed.   Constitutional:   Alert and oriented. Well appearing and in no distress. Eyes:   No scleral icterus.  EOMI. No nystagmus. No conjunctival pallor.  ENT   Head:   Normocephalic and atraumatic.   Nose:   No congestion/rhinnorhea.    Mouth/Throat:   MMM, no pharyngeal erythema. No peritonsillar mass.    Neck:   No meningismus. Full ROM. Hematological/Lymphatic/Immunilogical:   No cervical lymphadenopathy. Cardiovascular:   RRR. Symmetric bilateral radial and DP pulses.  No murmurs.  Respiratory:   Normal respiratory effort without tachypnea/retractions. Breath sounds are clear and equal bilaterally. No wheezes/rales/rhonchi. Gastrointestinal:   Soft and nontender. Non distended. There is no CVA tenderness.  No  rebound, rigidity, or guarding. Genitourinary:   deferred Musculoskeletal:   Normal range of motion in all extremities. No joint effusions.  No lower extremity tenderness.  No edema. Neurologic:   Normal speech and language.  Motor grossly intact. No gross focal neurologic deficits are appreciated.  Skin:    Skin is warm, dry and intact. No rash noted.  No petechiae, purpura, or bullae. onycchiomycosis. No foot wounds  ____________________________________________    LABS (pertinent positives/negatives) (all labs ordered are listed, but only abnormal results are displayed) Labs Reviewed - No data to display ____________________________________________   EKG    ____________________________________________    RADIOLOGY  No results found.  ____________________________________________   PROCEDURES Procedures  ____________________________________________     CLINICAL IMPRESSION / ASSESSMENT AND PLAN / ED COURSE  Pertinent labs & imaging results that were available during my care of the patient were reviewed by me and considered in my medical decision making (see chart for details).     Clinical Course as of Dec 09 1339  Wed Dec 08, 2017  1128 FS 121. No signs of acute medical decompensation. Suitable for outpt follow up. Has resources for [facility] and [facility] grief counseling program.  [PS]    Clinical Course User Index [PS] Sharman CheekStafford, Mattox Schorr, MD    ----------------------------------------- 1:41 PM on 12/08/2017 -----------------------------------------  On having a discharged discussion with the patient, he now states that he actively wants to kill himself when he leaves the hospital. States that he'll never get over the death of this child and wants to move onto the after life so he doesn't have to experience this pain and suffering anymore. Seems to making a suicide agreement with his wife next to him. Will IVC, start psychiatry  protocols   ____________________________________________   FINAL CLINICAL IMPRESSION(S) / ED DIAGNOSES    Final diagnoses:  Grief at loss of child  Suicidal ideation      This SmartLink is deprecated. Use AVSMEDLIST instead to display the medication list for a patient.   Portions of this note were generated with dragon dictation software. Dictation errors may occur despite best attempts at proofreading.    Sharman CheekStafford, Cookie Pore, MD 12/08/17 1234    Sharman CheekStafford, Toshie Demelo, MD 12/08/17 1343

## 2017-12-08 NOTE — Discharge Instructions (Signed)
[facility], or follow up with [facility] for continued assistance with your feelings of grief.  Continue taking your home medications as prescribed.

## 2017-12-08 NOTE — Consult Note (Signed)
[facility] Face-to-Face Psychiatry Consult   Reason for Consult: Consult for 57 year old man who comes to the hospital reporting severe depression Referring Physician:  Don Perking Patient Identification: Duane Randolph. MRN:  [id number] Principal Diagnosis: Severe major depression, single episode, without psychotic features [facility]) Diagnosis:   Patient Active Problem List   Diagnosis Date Noted  . Severe major depression, single episode, without psychotic features (HCC) [F32.2] 12/08/2017  . Gastric reflux [K21.9] 12/08/2017  . History of substance abuse [Z87.898] 12/08/2017  . Hematemesis [K92.0] 10/09/2016    Total Time spent with patient: 1 hour  Subjective:   Duane Randolph. is a 57 y.o. male patient admitted with "I had a nervous breakdown".  HPI: Patient interviewed chart reviewed.  57 year old man came to the emergency room today along with his girlfriend both of them reporting severe depression.  Patient says that he has had to deal with too many deaths this year.  His girlfriend's mother died back in the spring.  Sometime after that another person died.  The most important one though is that on 01/12/2025his newborn son died.  He and his girlfriend had a baby who was born around December 13.  Almost immediately thereafter the baby came down with what the girlfriend said was an upper respiratory infection and then died.  Patient has been feeling extremely sad since then.  Mood is been getting worse.  He is not sleeping solidly at night.  He has not been eating well.  He has been ruminating and thinking negative thoughts.  Energy level has been low.  He started having suicidal thoughts although he will not tell me a specific plan.  For some reason he stopped taking his medicine several days ago.  He cannot give me any understanding of that.  Patient evades my attempts to get him to tell me whether he has been using drugs recently.  Medical history: He says he has a lot of ulcers and he  does have a history of hematemesis and is supposed to be on pantoprazole.  Substance abuse history: Documented history of positive drug screens in past substance abuse of multiple substances including opiates and cocaine.  Social history: Not working.  Lives with his Duane Randolph.  Past Psychiatric History: Patient has never been admitted to a psychiatric unit.  Denies ever trying to kill himself in the past.  He goes to [facility] and says he has been diagnosed with PTSD and depression and that he takes Seroquel and Remeron.  For reasons that I do not understand he stopped taking his medicine several days ago.  Risk to Self: Is patient at risk for suicide?: No Risk to Others:   Prior Inpatient Therapy:   Prior Outpatient Therapy:    Past Medical History:  Past Medical History:  Diagnosis Date  . Arthritis     Past Surgical History:  Procedure Laterality Date  . ESOPHAGOGASTRODUODENOSCOPY (EGD) WITH PROPOFOL N/A 10/09/2016   Procedure: ESOPHAGOGASTRODUODENOSCOPY (EGD) WITH PROPOFOL;  Surgeon: Scot Jun, MD;  Location: [facility] ENDOSCOPY;  Service: Endoscopy;  Laterality: N/A;  . EYE SURGERY     Family History: History reviewed. No pertinent family history. Family Psychiatric  History: No known history Social History:  Social History   Substance and Sexual Activity  Alcohol Use No     Social History   Substance and Sexual Activity  Drug Use Not on file    Social History   Socioeconomic History  . Marital status: Single    Spouse  name: None  . Number of children: None  . Years of education: None  . Highest education level: None  Social Needs  . Financial resource strain: None  . Food insecurity - worry: None  . Food insecurity - inability: None  . Transportation needs - medical: None  . Transportation needs - non-medical: None  Occupational History  . None  Tobacco Use  . Smoking status: Current Some Day Smoker  . Smokeless tobacco: Never Used  Substance and Sexual  Activity  . Alcohol use: No  . Drug use: None  . Sexual activity: None  Other Topics Concern  . None  Social History Narrative  . None   Additional Social History:    Allergies:  No Known Allergies  Labs: No results found for this or any previous visit (from the past 48 hour(s)).  Current Facility-Administered Medications  Medication Dose Route Frequency Provider Last Rate Last Dose  . mirtazapine (REMERON) tablet 45 mg  45 mg Oral QHS Clapacs, John T, MD      . pantoprazole (PROTONIX) EC tablet 40 mg  40 mg Oral BID Clapacs, John T, MD      . QUEtiapine (SEROQUEL) tablet 400 mg  400 mg Oral QHS Clapacs, John T, MD       Current Outpatient Medications  Medication Sig Dispense Refill  . gabapentin (NEURONTIN) 100 MG capsule Take 100 mg by mouth 3 (three) times daily.    . mirtazapine (REMERON) 45 MG tablet Take 45 mg by mouth at bedtime.    Marland Kitchen. QUEtiapine (SEROQUEL) 400 MG tablet Take 800 mg by mouth at bedtime.     . pantoprazole (PROTONIX) 40 MG tablet Take 1 tablet (40 mg total) by mouth 2 (two) times daily. (Patient not taking: Reported on 12/08/2017) 60 tablet 0  . sucralfate (CARAFATE) 1 GM/10ML suspension Take 10 mLs (1 g total) by mouth 4 (four) times daily -  with meals and at bedtime. (Patient not taking: Reported on 12/08/2017) 420 mL 0    Musculoskeletal: Strength & Muscle Tone: within normal limits Gait & Station: normal Patient leans: N/A  Psychiatric Specialty Exam: Physical Exam  Nursing note and vitals reviewed. Constitutional: He appears well-developed and well-nourished.  HENT:  Head: Normocephalic and atraumatic.  Eyes: Conjunctivae are normal. Pupils are equal, round, and reactive to light.  Neck: Normal range of motion.  Cardiovascular: Regular rhythm and normal heart sounds.  Respiratory: Effort normal. No respiratory distress.  GI: Soft.  Musculoskeletal: Normal range of motion.  Neurological: He is alert.  Skin: Skin is warm and dry.  Psychiatric:  His speech is normal and behavior is normal. His affect is not blunt. Thought content is not paranoid. Cognition and memory are normal. He expresses impulsivity. He exhibits a depressed mood. He expresses suicidal ideation. He expresses no homicidal ideation.    Review of Systems  Constitutional: Negative.   HENT: Negative.   Eyes: Negative.   Respiratory: Negative.   Cardiovascular: Negative.   Gastrointestinal: Negative.   Musculoskeletal: Negative.   Skin: Negative.   Neurological: Negative.   Psychiatric/Behavioral: Positive for depression and suicidal ideas. Negative for hallucinations. The patient is nervous/anxious and has insomnia.     Blood pressure (!) 181/102, pulse 90, temperature 97.6 F (36.4 C), temperature source Oral, resp. rate 18, height 5\' 7"  (1.702 m), weight 150 lb (68 kg), SpO2 97 %.Body mass index is 23.49 kg/m.  General Appearance: Casual  Eye Contact:  Good  Speech:  Clear and Coherent  Volume:  Normal  Mood:  Depressed  Affect:  Patient has a rather melodramatic tone to his whole affect.  Thought Process:  Coherent  Orientation:  Full (Time, Place, and Person)  Thought Content:  Rumination  Suicidal Thoughts:  Yes.  with intent/plan  Homicidal Thoughts:  No  Memory:  Immediate;   Good Recent;   Fair Remote;   Fair  Judgement:  Fair  Insight:  Fair  Psychomotor Activity:  Decreased  Concentration:  Concentration: Fair  Recall:  [employer] of Knowledge:  Fair  Language:  Fair  Akathisia:  No  Handed:  Right  AIMS (if indicated):     Assets:  Desire for Improvement Physical Health Resilience  ADL's:  Intact  Cognition:  WNL  Sleep:        Treatment Plan Summary: Daily contact with patient to assess and evaluate symptoms and progress in treatment, Medication management and Plan 57 year old man who was reporting symptoms of severe depression including suicidal ideation.  Both the patient and his Duane Randolph have a rather odd affects about them.   They both have significant histories of substance abuse.  It is apparently true that they had a baby that died a couple weeks ago but there seems to be something else going on as well prompting them to walk to the hospital.  I have explained to both of them that they cannot be admitted to the same psychiatric unit.  1 of them will have to be referred out to another hospital.  Meanwhile starting back on his Seroquel and Remeron and pantoprazole.  Disposition: Recommend psychiatric Inpatient admission when medically cleared. Supportive therapy provided about ongoing stressors.  Mordecai Rasmussen, MD 12/08/2017 7:24 PM

## 2017-12-08 NOTE — ED Triage Notes (Signed)
Pt to ED with wife. Wife recently gave birth and lost son on 12/17. Pt denies SI/HI.

## 2017-12-08 NOTE — ED Notes (Signed)
Pt crying while speaking to this Clinical research associatewriter. Endorses SI after the loss of his newborn son.  RN explained to patient the psychiatrist would be speaking to him sometime today. Pt accepting.  Maintained on 15 minute checks and observation by security camera for safety.

## 2017-12-08 NOTE — ED Notes (Signed)
Pt is alert and oriented this evening. Currently endorses passive SI but does contract for safety. Tx plan discussed, nutrition provided and 15 minute checks ongoing for safety.

## 2017-12-08 NOTE — ED Notes (Signed)
MD brought to family wait room due to patient stating "I don't care if I live or die".  Patient to be changed out for voluntary commitment.

## 2017-12-08 NOTE — ED Notes (Signed)
Patient resting quietly in room. No noted distress or abnormal behaviors noted. Will continue 15 minute checks and observation by security camera for safety. 

## 2017-12-09 LAB — GLUCOSE, CAPILLARY: Glucose-Capillary: 121 mg/dL — ABNORMAL HIGH (ref 65–99)

## 2017-12-09 NOTE — BH Assessment (Signed)
Referral information for Placement have been faxed to: ? [facility] ([phone])  ? [facility] ([phone])  ? [facility] ([phone])  ? [facility] ([phone])  ? [facility] ([phone] 

## 2017-12-09 NOTE — BH Assessment (Signed)
Patient has been accepted to [facility]. [facility] Accepting physician is Dr. Wendall StadeKohl. Call report to [phone]. Representative was CumingsJustin. ER Staff is aware of it Lewie Loron(Emilie, ER Sect.; Dr. Mayford KnifeWilliams, ER MD & Lincoln Maxinlivette, Patient's Nurse)    Writer provided the patient with the phone to talk with his fiance to inform he was going to be transferred.

## 2017-12-09 NOTE — BH Assessment (Signed)
Assessment Note  Duane ClauseColonel R Wimberly Jr. is an 57 y.o. male preseting to the ED with concerns of worsening depression and suicidal ideations, weeks after the death of his newborn child.  Pt reports not  taking his medications for the past 4 days because he's been too upset. Pt reports symptoms of depression such as insomnia and a loss of appetite.  Pt and his girlfriend reported a plan to go into the woods and starve themselves to death.  Pt has a history of polysubstance abuse.  Pt denies HI or any auditory/visual hallucinations.  Diagnosis: Major Depressive Disorder  Past Medical History:  Past Medical History:  Diagnosis Date  . Arthritis     Past Surgical History:  Procedure Laterality Date  . ESOPHAGOGASTRODUODENOSCOPY (EGD) WITH PROPOFOL N/A 10/09/2016   Procedure: ESOPHAGOGASTRODUODENOSCOPY (EGD) WITH PROPOFOL;  Surgeon: Scot Junobert T Elliott, MD;  [facility];  Service: Endoscopy;  Laterality: N/A;  . EYE SURGERY      Family History: History reviewed. No pertinent family history.  Social History:  reports that he has been smoking.  he has never used smokeless tobacco. He reports that he does not drink alcohol. His drug history is not on file.  Additional Social History:  Alcohol / Drug Use Pain Medications: See PTA Prescriptions: See PTA Over the Counter: See PTA History of alcohol / drug use?: Yes(Pt has a history of polysubstance use) Negative Consequences of Use: Personal relationships, Financial  CIWA: CIWA-Ar BP: 135/86 Pulse Rate: 63 COWS:    Allergies: No Known Allergies  Home Medications:  (Not in a hospital admission)  OB/GYN Status:  No LMP for male patient.  General Assessment Data [facility] Assessment: In system Is this a Tele or Face-to-Face Assessment?: Face-to-Face Is this an Initial Assessment or a Re-assessment for this encounter?: Initial Assessment Marital status: Single Maiden name: na Is patient pregnant?:  No Pregnancy Status: No Living Arrangements: Other (Comment)(homeless) Can pt return to current living arrangement?: Yes Admission Status: Involuntary Is patient capable of signing voluntary admission?: No Referral Source: Self/Family/Friend Insurance type: Medicaid     Crisis Care Plan Living Arrangements: Other (Comment)(homeless) Legal Guardian: Other:(self) Name of Psychiatrist: none reported  Name of Therapist: none reported  Education Status Is patient currently in school?: No Current Grade: na Highest grade of school patient has completed: na Name of school: na Contact person: na  Risk to self with the past 6 months Suicidal Ideation: Yes-Currently Present Has patient been a risk to self within the past 6 months prior to admission? : No Suicidal Intent: No Has patient had any suicidal intent within the past 6 months prior to admission? : No Is patient at risk for suicide?: No Suicidal Plan?: No Has patient had any suicidal plan within the past 6 months prior to admission? : No Access to Means: No What has been your use of drugs/alcohol within the last 12 months?: cocaine, cannabis, etoh Previous Attempts/Gestures: No Other Self Harm Risks: polysubstance abuse Triggers for Past Attempts: None known Intentional Self Injurious Behavior: None Family Suicide History: No Recent stressful life event(s): Loss (Comment)(death of infant) Persecutory voices/beliefs?: No Depression: Yes Depression Symptoms: Loss of interest in usual pleasures, Feeling worthless/self pity, Guilt, Despondent Substance abuse history and/or treatment for substance abuse?: Yes Suicide prevention information given to non-admitted patients: Not applicable  Risk to Others within the past 6 months Homicidal Ideation: No Does patient have any lifetime risk of violence toward others beyond the six months prior  to admission? : No Thoughts of Harm to Others: No Current Homicidal Intent: No Current  Homicidal Plan: No Access to Homicidal Means: No Identified Victim: none identifed History of harm to others?: No Assessment of Violence: None Noted Violent Behavior Description: none identified Does patient have access to weapons?: No Criminal Charges Pending?: No Does patient have a court date: No Is patient on probation?: Unknown  Psychosis Hallucinations: None noted Delusions: None noted  Mental Status Report Appearance/Hygiene: In scrubs, Disheveled Eye Contact: Fair Motor Activity: Freedom of movement Speech: Logical/coherent Level of Consciousness: Drowsy, Irritable Mood: Depressed, Sad Affect: Appropriate to circumstance, Depressed, Sad Anxiety Level: Minimal Thought Processes: Relevant Judgement: Partial Orientation: Person, Place, Time, Situation Obsessive Compulsive Thoughts/Behaviors: None  Cognitive Functioning Concentration: Normal Memory: Recent Intact, Remote Intact IQ: Average Insight: Fair Impulse Control: Fair Appetite: Good Weight Loss: 0 Weight Gain: 0 Sleep: Decreased Total Hours of Sleep: 4 Vegetative Symptoms: None  ADLScreening [facility] Assessment Services) Patient's cognitive ability adequate to safely complete daily activities?: Yes Patient able to express need for assistance with ADLs?: Yes Independently performs ADLs?: Yes (appropriate for developmental age)  Prior Inpatient Therapy Prior Inpatient Therapy: No Prior Therapy Dates: na Prior Therapy Facilty/Provider(s): na Reason for Treatment: na  Prior Outpatient Therapy Prior Outpatient Therapy: No Prior Therapy Dates: na Prior Therapy Facilty/Provider(s): na Reason for Treatment: na Does patient have an ACCT team?: No Does patient have Intensive In-House Services?  : No Does patient have [facility] services? : No Does patient have P4CC services?: No  ADL Screening (condition at time of admission) Patient's cognitive ability adequate to safely complete daily activities?:  Yes Patient able to express need for assistance with ADLs?: Yes Independently performs ADLs?: Yes (appropriate for developmental age)       Abuse/Neglect Assessment (Assessment to be complete while patient is alone) Abuse/Neglect Assessment Can Be Completed: Yes Physical Abuse: Denies Verbal Abuse: Denies Sexual Abuse: Denies Exploitation of patient/patient's resources: Denies Self-Neglect: Denies Values / Beliefs Cultural Requests During Hospitalization: None Spiritual Requests During Hospitalization: None Consults Spiritual Care Consult Needed: No Social Work Consult Needed: No Merchant navy officer (For Healthcare) Does Patient Have a Medical Advance Directive?: Yes    Additional Information 1:1 In Past 12 Months?: No CIRT Risk: No Elopement Risk: No Does patient have medical clearance?: Yes     Disposition:  Disposition Initial Assessment Completed for this Encounter: Yes Disposition of Patient: Inpatient treatment program Type of inpatient treatment program: Adult  On Site Evaluation by:   Reviewed with Physician:    Artist [place] 12/09/2017 6:33 AM

## 2017-12-09 NOTE — ED Provider Notes (Signed)
-----------------------------------------   6:45 AM on 12/09/2017 -----------------------------------------   Blood pressure 135/86, pulse 63, temperature (!) 97.5 F (36.4 C), temperature source Oral, resp. rate 18, height 5\' 7"  (1.702 m), weight 68 kg (150 lb), SpO2 100 %.  The patient had no acute events since last update.  Calm and cooperative at this time.  Disposition is pending Psychiatry/Behavioral Medicine team recommendations.    Dionne BucySiadecki, Madelena Maturin, MD 12/09/17 [phone]

## 2018-01-06 ENCOUNTER — Encounter: Payer: Self-pay | Admitting: Emergency Medicine

## 2018-01-06 ENCOUNTER — Emergency Department
Admission: EM | Admit: 2018-01-06 | Discharge: 2018-01-06 | Disposition: A | Discharge status: Home / Self Care | Payer: Medicaid Other | Attending: Emergency Medicine | Admitting: Emergency Medicine

## 2018-01-06 DIAGNOSIS — F17200 Nicotine dependence, unspecified, uncomplicated: Secondary | ICD-10-CM | POA: Diagnosis not present

## 2018-01-06 DIAGNOSIS — J069 Acute upper respiratory infection, unspecified: Secondary | ICD-10-CM | POA: Diagnosis not present

## 2018-01-06 DIAGNOSIS — R0981 Nasal congestion: Secondary | ICD-10-CM | POA: Diagnosis present

## 2018-01-06 DIAGNOSIS — Z79899 Other long term (current) drug therapy: Secondary | ICD-10-CM | POA: Diagnosis not present

## 2018-01-06 DIAGNOSIS — J029 Acute pharyngitis, unspecified: Secondary | ICD-10-CM | POA: Insufficient documentation

## 2018-01-06 DIAGNOSIS — R509 Fever, unspecified: Secondary | ICD-10-CM | POA: Diagnosis not present

## 2018-01-06 LAB — GROUP A STREP BY PCR: Group A Strep by PCR: NOT DETECTED

## 2018-01-06 MED ORDER — PSEUDOEPH-BROMPHEN-DM 30-2-10 MG/5ML PO SYRP: 5.0000 mL | ORAL_SOLUTION | Freq: Four times a day (QID) | ORAL | 0 refills | Status: AC | PRN

## 2018-01-06 NOTE — Discharge Instructions (Signed)
Begin taking Bromfed-DM as needed for cough and congestion.  Increase fluids.  Voice rest as much as possible.  You may take Tylenol or ibuprofen as needed for throat pain, fever, body aches.

## 2018-01-06 NOTE — ED Triage Notes (Signed)
Pt reports that he has had cold symptoms for the last three days. He reports that his throat has been hurting him.

## 2018-01-06 NOTE — ED Notes (Signed)
See triage note   Presents with body aches, fever and sore throat for 3 days   States he had some vomiting yesterday  Fever at home was 102 ,but afebrile on arrival

## 2018-01-06 NOTE — ED Provider Notes (Signed)
[facility] Emergency Department Provider Note  ___________________________________________   First MD Initiated Contact with Patient 01/06/18 1244     (approximate)  I have reviewed the triage vital signs and the nursing notes.   HISTORY  Chief Complaint Nasal Congestion; Sore Throat; and Fever  HPI Duane Randolph. is a 57 y.o. male is here with complaint of cold symptoms for the last 3 days.  Patient states that his throat has been hurting him especially this morning.  He states that pain is worse with swallowing.  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  He is unaware of any fever and denies chills at this time.  He denies any nausea, vomiting or diarrhea.    Past Medical History:  Diagnosis Date  . Arthritis     Patient Active Problem List   Diagnosis Date Noted  . Severe major depression, single episode, without psychotic features (HCC) 12/08/2017  . Gastric reflux 12/08/2017  . History of substance abuse 12/08/2017  . Hematemesis 10/09/2016    Past Surgical History:  Procedure Laterality Date  . ESOPHAGOGASTRODUODENOSCOPY (EGD) WITH PROPOFOL N/A 10/09/2016   Procedure: ESOPHAGOGASTRODUODENOSCOPY (EGD) WITH PROPOFOL;  Surgeon: Scot Jun, MD;  Location: [facility] ENDOSCOPY;  Service: Endoscopy;  Laterality: N/A;  . EYE SURGERY      Prior to Admission medications   Medication Sig Start Date End Date Taking? Authorizing Provider  brompheniramine-pseudoephedrine-DM 30-2-10 MG/5ML syrup Take 5 mLs by mouth 4 (four) times daily as needed. 01/06/18   Tommi Rumps, PA-C  gabapentin (NEURONTIN) 100 MG capsule Take 100 mg by mouth 3 (three) times daily.    [provider]  mirtazapine (REMERON) 45 MG tablet Take 45 mg by mouth at bedtime.    [provider]  QUEtiapine (SEROQUEL) 400 MG tablet Take 800 mg by mouth at bedtime.     [provider]    Allergies Patient has no  known allergies.  History reviewed. No pertinent family history.  Social History Social History   Tobacco Use  . Smoking status: Current Some Day Smoker  . Smokeless tobacco: Never Used  Substance Use Topics  . Alcohol use: No  . Drug use: Not on file    Review of Systems Constitutional: No fever/chills Eyes: No visual changes. ENT: Positive for sore throat.  Positive for nasal congestion. Cardiovascular: Denies chest pain. Respiratory: Denies shortness of breath.  Positive cough. Gastrointestinal: No abdominal pain.  No nausea, no vomiting.  Musculoskeletal: Negative for back pain. Skin: Negative for rash. Neurological: Negative for headaches, focal weakness or numbness. ___________________________________________   PHYSICAL EXAM:  VITAL SIGNS: ED Triage Vitals  Enc Vitals Group     BP 01/06/18 1220 (!) 161/94     Pulse Rate 01/06/18 1218 85     Resp 01/06/18 1218 20     Temp 01/06/18 1218 98.9 F (37.2 C)     Temp Source 01/06/18 1218 Oral     SpO2 01/06/18 1218 98 %     Weight 01/06/18 1219 175 lb (79.4 kg)     Height 01/06/18 1219 5\' 7"  (1.702 m)     Head Circumference --      Peak Flow --      Pain Score 01/06/18 1218 10     Pain Loc --      Pain Edu? --      Excl. in GC? --    Constitutional: Alert and oriented. Well  appearing and in no acute distress. Eyes: Conjunctivae are normal. PERRL. EOMI. Head: Atraumatic. Nose: Mild congestion/rhinnorhea.  TMs are dull bilaterally. Mouth/Throat: Mucous membranes are moist.  Oropharynx non-erythematous.  Positive for laryngitis.  Positive for posterior drainage. Neck: No stridor.   Hematological/Lymphatic/Immunilogical: No cervical lymphadenopathy. Cardiovascular: Normal rate, regular rhythm. Grossly normal heart sounds.  Good peripheral circulation. Respiratory: Normal respiratory effort.  No retractions. Lungs CTAB. Musculoskeletal: No lower extremity tenderness nor edema.  No joint effusions. Neurologic:   Normal speech and language. No gross focal neurologic deficits are appreciated.  Skin:  Skin is warm, dry and intact.  Psychiatric: Mood and affect are normal. Speech and behavior are normal.  ____________________________________________   LABS (all labs ordered are listed, but only abnormal results are displayed)  Labs Reviewed  GROUP A STREP BY PCR    PROCEDURES  Procedure(s) performed: None  Procedures  Critical Care performed: No  ____________________________________________   INITIAL IMPRESSION / ASSESSMENT AND PLAN / ED COURSE  Patient was made aware the strep test was negative.  Patient was given information about laryngitis.  Patient requested something to eat while in the ED and was given crackers, peanut butter, ginger ale.  He was afebrile while in the ED.  He is encouraged to voice rest and given a prescription for  Bromfed DM. ____________________________________________   FINAL CLINICAL IMPRESSION(S) / ED DIAGNOSES  Final diagnoses:  Viral pharyngitis  Upper respiratory tract infection, unspecified type     ED Discharge Orders        Ordered    brompheniramine-pseudoephedrine-DM 30-2-10 MG/5ML syrup  4 times daily PRN     01/06/18 1437       Note:  This document was prepared using Dragon voice recognition software and may include unintentional dictation errors.    Tommi RumpsSummers, Rhonda L, PA-C 01/06/18 1431    Tommi RumpsSummers, Rhonda L, PA-C 01/06/18 1439    Minna AntisPaduchowski, Kevin, MD 01/06/18 1544

## 2018-04-12 ENCOUNTER — Encounter: Payer: Self-pay | Admitting: Emergency Medicine

## 2018-04-12 ENCOUNTER — Emergency Department: Payer: Medicaid Other

## 2018-04-12 ENCOUNTER — Emergency Department
Admission: EM | Admit: 2018-04-12 | Discharge: 2018-04-12 | Disposition: A | Discharge status: Home / Self Care | Payer: Medicaid Other | Attending: Emergency Medicine | Admitting: Emergency Medicine

## 2018-04-12 ENCOUNTER — Other Ambulatory Visit: Payer: Self-pay

## 2018-04-12 DIAGNOSIS — K2920 Alcoholic gastritis without bleeding: Secondary | ICD-10-CM

## 2018-04-12 DIAGNOSIS — Z79899 Other long term (current) drug therapy: Secondary | ICD-10-CM | POA: Diagnosis not present

## 2018-04-12 DIAGNOSIS — F17210 Nicotine dependence, cigarettes, uncomplicated: Secondary | ICD-10-CM | POA: Insufficient documentation

## 2018-04-12 DIAGNOSIS — F1097 Alcohol use, unspecified with alcohol-induced persisting dementia: Secondary | ICD-10-CM | POA: Diagnosis not present

## 2018-04-12 DIAGNOSIS — R1013 Epigastric pain: Secondary | ICD-10-CM

## 2018-04-12 HISTORY — DX: Unspecified asthma, uncomplicated: J45.909

## 2018-04-12 LAB — SALICYLATE LEVEL

## 2018-04-12 LAB — COMPREHENSIVE METABOLIC PANEL
ALBUMIN: 4.6 g/dL (ref 3.5–5.0)
ALT: 23 U/L (ref 17–63)
ANION GAP: 6 (ref 5–15)
AST: 22 U/L (ref 15–41)
Alkaline Phosphatase: 56 U/L (ref 38–126)
BILIRUBIN TOTAL: 0.5 mg/dL (ref 0.3–1.2)
BUN: 11 mg/dL (ref 6–20)
CALCIUM: 9.3 mg/dL (ref 8.9–10.3)
CO2: 25 mmol/L (ref 22–32)
Chloride: 106 mmol/L (ref 101–111)
Creatinine, Ser: 1.06 mg/dL (ref 0.61–1.24)
GFR calc non Af Amer: >60 mL/min (ref >60)
GLUCOSE: 90 mg/dL (ref 65–99)
Potassium: 3.7 mmol/L (ref 3.5–5.1)
Sodium: 137 mmol/L (ref 135–145)
TOTAL PROTEIN: 7.4 g/dL (ref 6.5–8.1)

## 2018-04-12 LAB — URINALYSIS, COMPLETE (UACMP) WITH MICROSCOPIC
Bacteria, UA: NONE SEEN
Bilirubin Urine: NEGATIVE
Glucose, UA: NEGATIVE mg/dL
HGB URINE DIPSTICK: NEGATIVE
Ketones, ur: NEGATIVE mg/dL
Leukocytes, UA: NEGATIVE
Nitrite: NEGATIVE
Protein, ur: NEGATIVE mg/dL
SPECIFIC GRAVITY, URINE: 1.012 (ref 1.005–1.030)
Squamous Epithelial / LPF: NONE SEEN (ref 0–5)
pH: 7 (ref 5.0–8.0)

## 2018-04-12 LAB — CBC WITH DIFFERENTIAL/PLATELET
Basophils Absolute: 0 10*3/uL (ref 0–0.1)
Basophils Relative: 1 %
EOS ABS: 0.1 10*3/uL (ref 0–0.7)
EOS PCT: 1 %
HCT: 45.9 % (ref 40.0–52.0)
Hemoglobin: 15.3 g/dL (ref 13.0–18.0)
LYMPHS ABS: 0.9 10*3/uL — AB (ref 1.0–3.6)
LYMPHS PCT: 13 %
MCH: 27.7 pg (ref 26.0–34.0)
MCHC: 33.3 g/dL (ref 32.0–36.0)
MCV: 83.2 fL (ref 80.0–100.0)
Monocytes Absolute: 0.6 10*3/uL (ref 0.2–1.0)
Monocytes Relative: 9 %
NEUTROS ABS: 5.3 10*3/uL (ref 1.4–6.5)
Neutrophils Relative %: 76 %
PLATELETS: 258 10*3/uL (ref 150–440)
RBC: 5.53 MIL/uL (ref 4.40–5.90)
RDW: 15.1 % — ABNORMAL HIGH (ref 11.5–14.5)
WBC: 7 10*3/uL (ref 3.8–10.6)

## 2018-04-12 LAB — URINE DRUG SCREEN, QUALITATIVE (ARMC ONLY)
Amphetamines, Ur Screen: NOT DETECTED
BENZODIAZEPINE, UR SCRN: NOT DETECTED
Barbiturates, Ur Screen: NOT DETECTED
Cannabinoid 50 Ng, Ur ~~LOC~~: POSITIVE — AB
Cocaine Metabolite,Ur ~~LOC~~: POSITIVE — AB
MDMA (Ecstasy)Ur Screen: NOT DETECTED
Methadone Scn, Ur: NOT DETECTED
Opiate, Ur Screen: NOT DETECTED
Phencyclidine (PCP) Ur S: NOT DETECTED
Tricyclic, Ur Screen: NOT DETECTED

## 2018-04-12 LAB — PROTIME-INR
INR: 1.2
Prothrombin Time: 15.1 seconds (ref 11.4–15.2)

## 2018-04-12 LAB — LIPASE, BLOOD: Lipase: 22 U/L (ref 11–51)

## 2018-04-12 LAB — TYPE AND SCREEN
ABO/RH(D): AB POS
Antibody Screen: NEGATIVE

## 2018-04-12 LAB — FOLATE: Folate: 11.1 ng/mL (ref >5.9)

## 2018-04-12 LAB — ACETAMINOPHEN LEVEL

## 2018-04-12 LAB — ETHANOL

## 2018-04-12 MED ORDER — GI COCKTAIL ~~LOC~~
30.0000 mL | Freq: Once | ORAL | Status: AC
  Administered 2018-04-12: 30 mL via ORAL
  Filled 2018-04-12: qty 30

## 2018-04-12 MED ORDER — THIAMINE HCL 100 MG/ML IJ SOLN
100.0000 mg | Freq: Once | INTRAMUSCULAR | Status: AC
Start: 2018-04-12 — End: 2018-04-12
  Administered 2018-04-12: 100 mg via INTRAVENOUS
  Filled 2018-04-12: qty 2

## 2018-04-12 MED ORDER — IOPAMIDOL (ISOVUE-300) INJECTION 61%
30.0000 mL | Freq: Once | INTRAVENOUS | Status: AC | PRN
  Administered 2018-04-12: 30 mL via ORAL

## 2018-04-12 MED ORDER — FAMOTIDINE IN NACL 20-0.9 MG/50ML-% IV SOLN
20.0000 mg | Freq: Once | INTRAVENOUS | Status: AC
  Administered 2018-04-12: 20 mg via INTRAVENOUS
  Filled 2018-04-12: qty 50

## 2018-04-12 MED ORDER — HALOPERIDOL LACTATE 5 MG/ML IJ SOLN
2.5000 mg | Freq: Once | INTRAMUSCULAR | Status: AC
  Administered 2018-04-12: 2.5 mg via INTRAVENOUS
  Filled 2018-04-12: qty 1

## 2018-04-12 MED ORDER — SODIUM CHLORIDE 0.9 % IV BOLUS
1000.0000 mL | Freq: Once | INTRAVENOUS | Status: AC
  Administered 2018-04-12: 1000 mL via INTRAVENOUS

## 2018-04-12 MED ORDER — IOPAMIDOL (ISOVUE-370) INJECTION 76%
75.0000 mL | Freq: Once | INTRAVENOUS | Status: AC | PRN
  Administered 2018-04-12: 75 mL via INTRAVENOUS

## 2018-04-12 MED ORDER — PANTOPRAZOLE SODIUM 40 MG PO TBEC: 40.0000 mg | DELAYED_RELEASE_TABLET | Freq: Every day | ORAL | 1 refills | Status: AC

## 2018-04-12 NOTE — ED Triage Notes (Signed)
Pt presents to ED via [facility] c/o generalized abd pain. EMS report they were called by a store owner per pts request after pt had been walking down the street since 0630. Pt reports not eating anything x4 days and bloody vomit today. Hx upper GI bleed 2017, pt reports not taking his meds for two days. Pt denies bloody or black stools at this time.

## 2018-04-12 NOTE — Discharge Instructions (Signed)
Return to the emergency room for any new or worrisome symptoms including increased pain, vomiting, bleeding, black stool or you feel worse in any way, take medications as prescribed, follow closely with GI medicine and your primary care doctor, do not drink alcohol to excess.

## 2018-04-12 NOTE — ED Notes (Signed)

## 2018-04-12 NOTE — ED Notes (Signed)
Patient transported to CT 

## 2018-04-12 NOTE — ED Provider Notes (Addendum)
[facility] Emergency Department Provider Note  ____________________________________________   I have reviewed the triage vital signs and the nursing notes. Where available I have reviewed prior notes and, if possible and indicated, outside hospital notes.    HISTORY  Chief Complaint Abdominal Pain    HPI Duane Randolph. is a 57 y.o. male of ulcers and alcohol abuse, who states that his girlfriend broke up with him or vice versa 3 days ago and he is been drinking a great deal since then.  He states that he started throwing up yesterday.  He states there was some blood mixed in with his vomit.  He denies melena or bright red blood per rectum.  He states that he has epigastric abdominal pain.  He wants to be admitted because of his "ulcers".  He states that even though he was throwing up starting yesterday, he was able to drink alcohol until 1 or 2 this morning.  He does have epigastric abdominal pain which is worse with vomiting.  Denies fever or chills.  No headache no stiff neck.  Nothing makes it better, is worse when he drinks.  No antecedent treatment.    Past Medical History:  Diagnosis Date  . Arthritis     Patient Active Problem List   Diagnosis Date Noted  . Severe major depression, single episode, without psychotic features (HCC) 12/08/2017  . Gastric reflux 12/08/2017  . History of substance abuse 12/08/2017  . Hematemesis 10/09/2016    Past Surgical History:  Procedure Laterality Date  . ESOPHAGOGASTRODUODENOSCOPY (EGD) WITH PROPOFOL N/A 10/09/2016   Procedure: ESOPHAGOGASTRODUODENOSCOPY (EGD) WITH PROPOFOL;  Surgeon: Scot Jun, MD;  Location: [facility] ENDOSCOPY;  Service: Endoscopy;  Laterality: N/A;  . EYE SURGERY      Prior to Admission medications   Medication Sig Start Date End Date Taking? Authorizing Provider  brompheniramine-pseudoephedrine-DM 30-2-10 MG/5ML syrup Take 5 mLs by mouth 4 (four) times daily as needed. 01/06/18    Tommi Rumps, PA-C  gabapentin (NEURONTIN) 100 MG capsule Take 100 mg by mouth 3 (three) times daily.    [provider]  mirtazapine (REMERON) 45 MG tablet Take 45 mg by mouth at bedtime.    [provider]  QUEtiapine (SEROQUEL) 400 MG tablet Take 800 mg by mouth at bedtime.     [provider]    Allergies Bee venom and Tomato  History reviewed. No pertinent family history.  Social History Social History   Tobacco Use  . Smoking status: Current Every Day Smoker    Packs/day: 1.50    Types: Cigarettes  . Smokeless tobacco: Never Used  Substance Use Topics  . Alcohol use: Yes    Comment: 2 40oz beers/day  . Drug use: Yes    Types: Marijuana, Cocaine    Comment: both last ised 04/11/17    Review of Systems Constitutional: No fever/chills Eyes: No visual changes. ENT: No sore throat. No stiff neck no neck pain Cardiovascular: Denies chest pain. Respiratory: Denies shortness of breath. Gastrointestinal:   + vomiting.  No diarrhea.  No constipation. Genitourinary: Negative for dysuria. Musculoskeletal: Negative lower extremity swelling Skin: Negative for rash. Neurological: Negative for severe headaches, focal weakness or numbness.   ____________________________________________   PHYSICAL EXAM:  VITAL SIGNS: ED Triage Vitals [04/12/18 0821]  Enc Vitals Group     BP (!) 145/94     Pulse Rate 75     Resp 18     Temp 97.8 F (36.6 C)  Temp Source Oral     SpO2 100 %     Weight 180 lb (81.6 kg)     Height  (1.727 m)     Head Circumference      Peak Flow      Pain Score 10     Pain Loc      Pain Edu?      Excl. in GC?     Constitutional: Alert and oriented.  Patient seems to have a distractible exam, when I am out of the room he is seem to be texting on his cell phone when I came in the room he puts the phone down and holds his stomach. Eyes: Conjunctivae are normal Head: Atraumatic HEENT: No congestion/rhinnorhea.  Mucous membranes are moist.  Oropharynx non-erythematous Neck:   Nontender with no meningismus, no masses, no stridor Cardiovascular: Normal rate, regular rhythm. Grossly normal heart sounds.  Good peripheral circulation. Respiratory: Normal respiratory effort.  No retractions. Lungs CTAB. Abdominal: Soft positive tenderness to the epigastric region, somewhat distractible exam, voluntary guarding is noted.  Nonsurgical abdomen no distention.  Back:  There is no focal tenderness or step off.  there is no midline tenderness there are no lesions noted. there is no CVA tenderness Exam: Guaiac negative Musculoskeletal: No lower extremity tenderness, no upper extremity tenderness. No joint effusions, no DVT signs strong distal pulses no edema Neurologic:  Normal speech and language. No gross focal neurologic deficits are appreciated.  Skin:  Skin is warm, dry and intact. No rash noted. Psychiatric: Mood and affect are anxious. Speech and behavior are normal.  ____________________________________________   LABS (all labs ordered are listed, but only abnormal results are displayed)  Labs Reviewed  URINALYSIS, COMPLETE (UACMP) WITH MICROSCOPIC  URINE DRUG SCREEN, QUALITATIVE ([facility] ONLY)  ACETAMINOPHEN LEVEL  ETHANOL  CBC WITH DIFFERENTIAL/PLATELET  SALICYLATE LEVEL  PROTIME-INR  COMPREHENSIVE METABOLIC PANEL  LIPASE, BLOOD  FOLATE  TYPE AND SCREEN    Pertinent labs  results that were available during my care of the patient were reviewed by me and considered in my medical decision making (see chart for details). ____________________________________________  EKG  I personally interpreted any EKGs ordered by me or triage  ____________________________________________  RADIOLOGY  Pertinent labs & imaging results that were available during my care of the patient were reviewed by me and considered in my medical decision making (see chart for details). If possible, patient and/or family made  aware of any abnormal findings.  No results found. ____________________________________________    PROCEDURES  Procedure(s) performed: None  Procedures  Critical Care performed: None  ____________________________________________   INITIAL IMPRESSION / ASSESSMENT AND PLAN / ED COURSE  Pertinent labs & imaging results that were available during my care of the patient were reviewed by me and considered in my medical decision making (see chart for details).  Alcohol abuse here with vomiting epigastric abdominal pain reported blood.  Does have a history of ulcers in the past.  , patient does definitely seem to be exaggerating his symptoms to some extent which makes it difficult to determine exactly how sick he is.  He does have some tenderness to his abdomen but again there is some degree of exaggeration here.  We are going to give him IV fluids, thiamine and folate antiemetics antiacids, and I will check alcohol level blood work including type and screen, we will give him GI cocktail IV fluids, no evidence of rectal bleeding at this time which is reassuring.  ----------------------------------------- 10:54 AM  on 04/12/2018 -----------------------------------------  No vomiting or other signs or symptoms of acute disease since being here and he is sleeping comfortable in the bed however when I touch his stomach he still states that he has epigastric abdominal discomfort which is significant.  Blood work is normal vital signs are reassuring, globin is reassuring and he has no melena or guaiac positive stool.  No evidence of coingestions, not clear why he has significant epigastric abdominal discomfort which seems to not stop him from sleeping or otherwise appearing calm and relaxed when I am not investigating it but we will obtain CT to make sure nothing else is amiss.  If that is negative is my hope that we get him safely home  ----------------------------------------- 12:51 PM on  04/12/2018 -----------------------------------------  Patient in no acute distress resting comfortably as he has been since arrival.  Blood work CT scan and vital signs etc. are quite reassuring.  Patient is requesting food.  If he can tolerate p.o. we will discharge him on PPI.  Precaution follow-up given and understood.   ____________________________________________   FINAL CLINICAL IMPRESSION(S) / ED DIAGNOSES  Final diagnoses:  None      This chart was dictated using voice recognition software.  Despite best efforts to proofread,  errors can occur which can change meaning.      Jeanmarie Plant, MD 04/12/18 1610    Jeanmarie Plant, MD 04/12/18 1055    Jeanmarie Plant, MD 04/12/18 1251

## 2018-04-12 NOTE — ED Notes (Signed)
Pt reports daily drinking - x2 40oz beers/day and cocaine/marijuana use yesterday.

## 2018-04-12 NOTE — ED Notes (Signed)
Pt states he is unable to urinate at this time. Pt aware of continued need for sample.

## 2018-05-24 ENCOUNTER — Emergency Department
Admission: EM | Admit: 2018-05-24 | Discharge: 2018-05-26 | Disposition: A | Discharge status: Other Acute Inpatient Hospital | Payer: Medicaid Other | Attending: Emergency Medicine | Admitting: Emergency Medicine

## 2018-05-24 ENCOUNTER — Other Ambulatory Visit: Payer: Self-pay

## 2018-05-24 DIAGNOSIS — R45851 Suicidal ideations: Secondary | ICD-10-CM | POA: Diagnosis not present

## 2018-05-24 DIAGNOSIS — F17210 Nicotine dependence, cigarettes, uncomplicated: Secondary | ICD-10-CM | POA: Insufficient documentation

## 2018-05-24 DIAGNOSIS — F322 Major depressive disorder, single episode, severe without psychotic features: Secondary | ICD-10-CM | POA: Insufficient documentation

## 2018-05-24 DIAGNOSIS — J45909 Unspecified asthma, uncomplicated: Secondary | ICD-10-CM | POA: Insufficient documentation

## 2018-05-24 DIAGNOSIS — F1010 Alcohol abuse, uncomplicated: Secondary | ICD-10-CM

## 2018-05-24 DIAGNOSIS — F1410 Cocaine abuse, uncomplicated: Secondary | ICD-10-CM | POA: Diagnosis not present

## 2018-05-24 DIAGNOSIS — K219 Gastro-esophageal reflux disease without esophagitis: Secondary | ICD-10-CM | POA: Diagnosis present

## 2018-05-24 DIAGNOSIS — F1910 Other psychoactive substance abuse, uncomplicated: Secondary | ICD-10-CM | POA: Diagnosis not present

## 2018-05-24 DIAGNOSIS — Z046 Encounter for general psychiatric examination, requested by authority: Secondary | ICD-10-CM | POA: Diagnosis present

## 2018-05-24 DIAGNOSIS — Z79899 Other long term (current) drug therapy: Secondary | ICD-10-CM | POA: Diagnosis not present

## 2018-05-24 HISTORY — DX: Personal history of other diseases of the musculoskeletal system and connective tissue: Z87.39

## 2018-05-24 LAB — URINE DRUG SCREEN, QUALITATIVE (ARMC ONLY)
AMPHETAMINES, UR SCREEN: NOT DETECTED
BENZODIAZEPINE, UR SCRN: NOT DETECTED
COCAINE METABOLITE, UR ~~LOC~~: POSITIVE — AB
Cannabinoid 50 Ng, Ur ~~LOC~~: POSITIVE — AB
MDMA (ECSTASY) UR SCREEN: NOT DETECTED
METHADONE SCREEN, URINE: NOT DETECTED
OPIATE, UR SCREEN: NOT DETECTED
PHENCYCLIDINE (PCP) UR S: NOT DETECTED
Tricyclic, Ur Screen: POSITIVE — AB

## 2018-05-24 LAB — COMPREHENSIVE METABOLIC PANEL
ALBUMIN: 4 g/dL (ref 3.5–5.0)
ALK PHOS: 57 U/L (ref 38–126)
ALT: 24 U/L (ref 17–63)
AST: 26 U/L (ref 15–41)
Anion gap: 8 (ref 5–15)
BILIRUBIN TOTAL: 0.4 mg/dL (ref 0.3–1.2)
BUN: 16 mg/dL (ref 6–20)
CO2: 23 mmol/L (ref 22–32)
Calcium: 9.1 mg/dL (ref 8.9–10.3)
Chloride: 107 mmol/L (ref 101–111)
Creatinine, Ser: 1.05 mg/dL (ref 0.61–1.24)
GFR calc Af Amer: >60 mL/min (ref >60)
GFR calc non Af Amer: >60 mL/min (ref >60)
GLUCOSE: 125 mg/dL — AB (ref 65–99)
POTASSIUM: 4 mmol/L (ref 3.5–5.1)
SODIUM: 138 mmol/L (ref 135–145)
TOTAL PROTEIN: 6.8 g/dL (ref 6.5–8.1)

## 2018-05-24 LAB — CBC
HEMATOCRIT: 44.6 % (ref 40.0–52.0)
HEMOGLOBIN: 15 g/dL (ref 13.0–18.0)
MCH: 27.7 pg (ref 26.0–34.0)
MCHC: 33.8 g/dL (ref 32.0–36.0)
MCV: 81.9 fL (ref 80.0–100.0)
Platelets: 236 10*3/uL (ref 150–440)
RBC: 5.44 MIL/uL (ref 4.40–5.90)
RDW: 15.1 % — AB (ref 11.5–14.5)
WBC: 6.7 10*3/uL (ref 3.8–10.6)

## 2018-05-24 LAB — ETHANOL: Alcohol, Ethyl (B): <10 mg/dL (ref <10)

## 2018-05-24 LAB — SALICYLATE LEVEL: Salicylate Lvl: <7 mg/dL (ref 2.8–30.0)

## 2018-05-24 LAB — ACETAMINOPHEN LEVEL: Acetaminophen (Tylenol), Serum: <10 ug/mL — ABNORMAL LOW (ref 10–30)

## 2018-05-24 NOTE — ED Notes (Signed)
Pt. Transferred to BHU from ED to room 4 after screening for contraband. Report to include Situation, Background, Assessment and Recommendations from RN. Pt. Oriented to unit including Q15 minute rounds as well as the security cameras for their protection. Patient is alert and oriented, warm and dry in no acute distress. Patient denies SI, HI, and AVH. Pt. Encouraged to let me know if needs arise.  

## 2018-05-24 NOTE — ED Provider Notes (Signed)
[facility] Emergency Department Provider Note  ____________________________________________  Time seen: Approximately 7:16 PM  I have reviewed the triage vital signs and the nursing notes.   HISTORY  Chief Complaint Suicidal   HPI Duane Randolph. is a 57 y.o. male history of bipolar and polysubstance abuse including alcohol, crack and cocaine who presents for evaluation of suicidal ideation.  Patient reports that he has been using crack, cocaine, and alcohol daily for 5 months.  Has not been eating for several weeks.  He reports that today at 4 PM had sudden onset of suicidal ideation with no plan.  He reports prior episodes of suicidal ideation but never a suicide attempt.  Patient presented voluntarily requesting help. He reports feeling depressed and wishes to detox.   Chief Complaint: depression  Severity: severe Duration: several weeks Context: The setting of medication noncompliance and polysubstance abuse Modifying factors: Worse with drug and alcohol use Associated signs/symptoms : Passive suicidal ideation with no plan     Past Medical History:  Diagnosis Date  . Arthritis   . Asthma   . H/O degenerative disc disease     Patient Active Problem List   Diagnosis Date Noted  . Severe major depression, single episode, without psychotic features (HCC) 12/08/2017  . Gastric reflux 12/08/2017  . History of substance abuse 12/08/2017  . Hematemesis 10/09/2016    Past Surgical History:  Procedure Laterality Date  . ESOPHAGOGASTRODUODENOSCOPY (EGD) WITH PROPOFOL N/A 10/09/2016   Procedure: ESOPHAGOGASTRODUODENOSCOPY (EGD) WITH PROPOFOL;  Surgeon: Scot Jun, MD;  Location: [facility] ENDOSCOPY;  Service: Endoscopy;  Laterality: N/A;  . EYE SURGERY      Prior to Admission medications   Medication Sig Start Date End Date Taking? Authorizing Provider  brompheniramine-pseudoephedrine-DM 30-2-10 MG/5ML syrup Take 5 mLs by mouth 4 (four)  times daily as needed. 01/06/18   Tommi Rumps, PA-C  gabapentin (NEURONTIN) 100 MG capsule Take 100 mg by mouth 3 (three) times daily.    [provider]  mirtazapine (REMERON) 45 MG tablet Take 45 mg by mouth at bedtime.    [provider]  pantoprazole (PROTONIX) 40 MG tablet Take 1 tablet (40 mg total) by mouth daily. 04/12/18 04/12/19  Jeanmarie Plant, MD  QUEtiapine (SEROQUEL) 400 MG tablet Take 800 mg by mouth at bedtime.     [provider]    Allergies Bee venom and Tomato  History reviewed. No pertinent family history.  Social History Social History   Tobacco Use  . Smoking status: Current Every Day Smoker    Packs/day: 1.50    Types: Cigarettes  . Smokeless tobacco: Never Used  Substance Use Topics  . Alcohol use: Yes    Comment: 2 40oz beers/day  . Drug use: Yes    Types: Marijuana, Cocaine    Comment: both last ised 04/11/17    Review of Systems  Constitutional: Negative for fever. Eyes: Negative for visual changes. ENT: Negative for sore throat. Neck: No neck pain  Cardiovascular: Negative for chest pain. Respiratory: Negative for shortness of breath. Gastrointestinal: Negative for abdominal pain, vomiting or diarrhea. Genitourinary: Negative for dysuria. Musculoskeletal: Negative for back pain. Skin: Negative for rash. Neurological: Negative for headaches, weakness or numbness. Psych: + depression and SI. No HI  ____________________________________________   PHYSICAL EXAM:  VITAL SIGNS: ED Triage Vitals [05/24/18 1755]  Enc Vitals Group     BP 122/60     Pulse Rate (!) 115     Resp 16  Temp 98.2 F (36.8 C)     Temp Source Oral     SpO2 97 %     Weight 140 lb (63.5 kg)     Height 5\' 7"  (1.702 m)     Head Circumference      Peak Flow      Pain Score 10     Pain Loc      Pain Edu?      Excl. in GC?     Constitutional: Alert and oriented, appears intoxicated, no distress.  HEENT:      Head: Normocephalic  and atraumatic.         Eyes: Conjunctivae are normal. Sclera is non-icteric.       Mouth/Throat: Mucous membranes are moist.       Neck: Supple with no signs of meningismus. Cardiovascular: Regular rate and rhythm. No murmurs, gallops, or rubs. 2+ symmetrical distal pulses are present in all extremities. No JVD. Respiratory: Normal respiratory effort. Lungs are clear to auscultation bilaterally. No wheezes, crackles, or rhonchi.  Gastrointestinal: Soft, non tender, and non distended with positive bowel sounds. No rebound or guarding. Musculoskeletal: Nontender with normal range of motion in all extremities. No edema, cyanosis, or erythema of extremities. Neurologic: Normal speech and language. Face is symmetric. Moving all extremities. No gross focal neurologic deficits are appreciated. Skin: Skin is warm, dry and intact. No rash noted. Psychiatric: Mood and affect are normal. Speech and behavior are normal.  ____________________________________________   LABS (all labs ordered are listed, but only abnormal results are displayed)  Labs Reviewed  COMPREHENSIVE METABOLIC PANEL - Abnormal; Notable for the following components:      Result Value   Glucose, Bld 125 (*)    All other components within normal limits  ACETAMINOPHEN LEVEL - Abnormal; Notable for the following components:   Acetaminophen (Tylenol), Serum <10 (*)    All other components within normal limits  CBC - Abnormal; Notable for the following components:   RDW 15.1 (*)    All other components within normal limits  ETHANOL  SALICYLATE LEVEL  URINE DRUG SCREEN, QUALITATIVE ([facility] ONLY)   ____________________________________________  EKG  none  ____________________________________________  RADIOLOGY  none  ____________________________________________   PROCEDURES  Procedure(s) performed: None Procedures Critical Care performed:  None ____________________________________________   INITIAL IMPRESSION /  ASSESSMENT AND PLAN / ED COURSE  57 y.o. male history of bipolar and polysubstance abuse including alcohol, crack and cocaine who presents for evaluation of suicidal ideation.  Patient is high risk for suicide attempt therefore he was made IVC.  Labs for medical clearance with no acute findings.  Psychiatry will be consulted.      As part of my medical decision making, I reviewed the following data within the electronic MEDICAL RECORD NUMBER Nursing notes reviewed and incorporated, Labs reviewed , Old chart reviewed, A consult was requested and obtained from this/these consultant(s) Psychiatry, Notes from prior ED visits and [place] Controlled Substance Database    Pertinent labs & imaging results that were available during my care of the patient were reviewed by me and considered in my medical decision making (see chart for details).    ____________________________________________   FINAL CLINICAL IMPRESSION(S) / ED DIAGNOSES  Final diagnoses:  Polysubstance abuse (HCC)  Suicidal ideation      NEW MEDICATIONS STARTED DURING THIS VISIT:  ED Discharge Orders    None       Note:  This document was prepared using Dragon voice recognition software and may include  unintentional dictation errors.    Nita Sickle, MD 05/24/18 [id number]

## 2018-05-24 NOTE — ED Notes (Signed)
Hourly rounding reveals patient sleeping in room. No complaints, stable, in no acute distress. Q15 minute rounds and monitoring via Security Cameras to continue. 

## 2018-05-24 NOTE — ED Notes (Signed)
Pt reports SI began at 4pm. Has had these thoughts before. Denies having a plan. Was recently treated for depression and bipolar disorder approx 6 months ago at "[facility] Pt admits to doing "crack cocaine" today. States he hasn't eaten in three days.  Pt would like to get into a program to get help for drug addiction and alcohol addiction.

## 2018-05-24 NOTE — ED Notes (Signed)
IVC/Consult pending/ Moved to BHU-4

## 2018-05-24 NOTE — ED Triage Notes (Signed)
Pt arrives to ED with c/o of feeling SI today. States cocaine, marijuana, crack, alcohol today x 5 months "straight" per pt. Last use 5 hours ago.  Alert, oriented, ambulatory. Denies HI. Denies plan. Pt states he walked here from [address].   Pt states "I need to get my back checked."

## 2018-05-25 DIAGNOSIS — F1010 Alcohol abuse, uncomplicated: Secondary | ICD-10-CM

## 2018-05-25 DIAGNOSIS — F1410 Cocaine abuse, uncomplicated: Secondary | ICD-10-CM

## 2018-05-25 DIAGNOSIS — F322 Major depressive disorder, single episode, severe without psychotic features: Secondary | ICD-10-CM

## 2018-05-25 MED ORDER — QUETIAPINE FUMARATE 200 MG PO TABS
800.0000 mg | ORAL_TABLET | Freq: Every day | ORAL | Status: DC
  Administered 2018-05-25: 800 mg via ORAL
  Filled 2018-05-25: qty 4

## 2018-05-25 MED ORDER — GABAPENTIN 300 MG PO CAPS
ORAL_CAPSULE | ORAL | Status: AC
  Administered 2018-05-25: 300 mg via ORAL
  Filled 2018-05-25: qty 1

## 2018-05-25 MED ORDER — PANTOPRAZOLE SODIUM 40 MG PO TBEC
40.0000 mg | DELAYED_RELEASE_TABLET | Freq: Every day | ORAL | Status: DC
  Administered 2018-05-25 – 2018-05-26 (×2): 40 mg via ORAL
  Filled 2018-05-25 (×2): qty 1

## 2018-05-25 MED ORDER — MIRTAZAPINE 15 MG PO TABS
45.0000 mg | ORAL_TABLET | Freq: Every day | ORAL | Status: DC
  Administered 2018-05-25: 45 mg via ORAL
  Filled 2018-05-25: qty 3

## 2018-05-25 MED ORDER — GABAPENTIN 300 MG PO CAPS
300.0000 mg | ORAL_CAPSULE | Freq: Three times a day (TID) | ORAL | Status: DC
  Administered 2018-05-25 – 2018-05-26 (×3): 300 mg via ORAL
  Filled 2018-05-25 (×3): qty 1

## 2018-05-25 NOTE — ED Notes (Signed)
Pt awake in dayroom. Pt given a sprite and asking for a cup of water as well. Pt aware he can have one drink at a time.

## 2018-05-25 NOTE — BH Assessment (Signed)
Per the request of patient and Dr. Toni Amendlapacs, patient information faxed to [facility] for inpatient treatment.

## 2018-05-25 NOTE — ED Notes (Signed)
Pt given sandwich tray and a sprite. 

## 2018-05-25 NOTE — ED Notes (Signed)
Pt. Requested and was given sprite and meal tray.

## 2018-05-25 NOTE — BH Assessment (Signed)
Assessment Note  Duane Randolph. is an 57 y.o. male. presented to ED due to thoughts of SI. Pt reports, "I been using drugs and alcohol everyday for 5 months. I live in an abandoned house, I'm tired of it." Pt reports that he is homeless and has lost at least 40 lbs in the past 5 months due to his drug use (marijuana, crack, and alcohol). Pt reports that he has experienced SI for a couple of days. Pt visited ED 5 months ago for similar reasons. Pt reports that he is currently receiving mental health services at [facility] for bipolar, PTSD, and generalized anxiety disorder.Pt reports no withdrawal symptoms.   At time of assessment pt anxious, cooperative, and oriented x4. Pt endorsed no AH, VH, or HI at time of assessment.   Diagnosis: Bipolar Disorder  Past Medical History:  Past Medical History:  Diagnosis Date  . Arthritis   . Asthma   . H/O degenerative disc disease     Past Surgical History:  Procedure Laterality Date  . ESOPHAGOGASTRODUODENOSCOPY (EGD) WITH PROPOFOL N/A 10/09/2016   Procedure: ESOPHAGOGASTRODUODENOSCOPY (EGD) WITH PROPOFOL;  Surgeon: Scot Jun, MD;  Location: [facility] ENDOSCOPY;  Service: Endoscopy;  Laterality: N/A;  . EYE SURGERY      Family History: History reviewed. No pertinent family history.  Social History:  reports that he has been smoking cigarettes.  He has been smoking about 1.50 packs per day. He has never used smokeless tobacco. He reports that he drinks alcohol. He reports that he has current or past drug history. Drugs: Marijuana and Cocaine.  Additional Social History:  Alcohol / Drug Use Pain Medications: see PTA Prescriptions: see PTA Over the Counter: see PTA History of alcohol / drug use?: Yes Longest period of sobriety (when/how long): unknown Negative Consequences of Use: Financial, Personal relationships  CIWA: CIWA-Ar BP: 115/90 Pulse Rate: 95 COWS:    Allergies:  Allergies  Allergen Reactions  . Bee Venom Other (See Comments)     Childhood allergy  . Tomato Swelling    Swollen eyes    Home Medications:  (Not in a hospital admission)  OB/GYN Status:  No LMP for male patient.  General Assessment Data Location of Assessment: [facility] ED TTS Assessment: In system Is this a Tele or Face-to-Face Assessment?: Face-to-Face Is this an Initial Assessment or a Re-assessment for this encounter?: Initial Assessment Marital status: Single Is patient pregnant?: No Pregnancy Status: No Living Arrangements: Other (Comment)(homeless) Can pt return to current living arrangement?: No Admission Status: Involuntary Is patient capable of signing voluntary admission?: No Referral Source: Self/Family/Friend Insurance type: medicaid  Medical Screening Exam [facility]) Medical Exam completed: Yes  Crisis Care Plan Living Arrangements: Other (Comment)(homeless) Legal Guardian: Other:(self) Name of Psychiatrist: [facility] Name of Therapist: Child psychotherapist  Education Status Is patient currently in school?: No Is the patient employed, unemployed or receiving disability?: Receiving disability income  Risk to self with the past 6 months Suicidal Ideation: Yes-Currently Present Has patient been a risk to self within the past 6 months prior to admission? : Yes Suicidal Intent: No Has patient had any suicidal intent within the past 6 months prior to admission? : No Is patient at risk for suicide?: Yes Suicidal Plan?: No Has patient had any suicidal plan within the past 6 months prior to admission? : No Access to Means: No What has been your use of drugs/alcohol within the last 12 months?: daily use Previous Attempts/Gestures: No How many times?: 0 Other Self  Harm Risks: drug use Triggers for Past Attempts: None known Intentional Self Injurious Behavior: None Family Suicide History: No Recent stressful life event(s): Conflict (Comment), Financial Problems, Recent negative physical changes, Trauma (Comment) Persecutory  voices/beliefs?: No Depression: Yes Depression Symptoms: Isolating, Fatigue, Guilt, Loss of interest in usual pleasures, Feeling worthless/self pity, Feeling angry/irritable Substance abuse history and/or treatment for substance abuse?: Yes Suicide prevention information given to non-admitted patients: Not applicable  Risk to Others within the past 6 months Homicidal Ideation: No Does patient have any lifetime risk of violence toward others beyond the six months prior to admission? : Unknown Thoughts of Harm to Others: No Current Homicidal Intent: No Current Homicidal Plan: No Access to Homicidal Means: No Identified Victim: None History of harm to others?: No Assessment of Violence: None Noted Violent Behavior Description: n/a Does patient have access to weapons?: No Criminal Charges Pending?: No Does patient have a court date: No Is patient on probation?: No  Psychosis Hallucinations: None noted Delusions: None noted  Mental Status Report Appearance/Hygiene: Unremarkable Eye Contact: Good Motor Activity: Freedom of movement Speech: Logical/coherent, Rapid Level of Consciousness: Alert Mood: Anxious, Preoccupied Affect: Anxious, Preoccupied Anxiety Level: Moderate Thought Processes: Coherent, Relevant Judgement: Partial Orientation: Person, Place, Time, Situation, Appropriate for developmental age Obsessive Compulsive Thoughts/Behaviors: Minimal  Cognitive Functioning Concentration: Fair Memory: Remote Intact, Recent Impaired Is patient IDD: No Is patient DD?: Unknown Insight: Fair Impulse Control: Fair Appetite: Poor Have you had any weight changes? : Loss Amount of the weight change? (lbs): 40 lbs Sleep: Decreased Total Hours of Sleep: 5 Vegetative Symptoms: None  ADLScreening [facility]) Patient's cognitive ability adequate to safely complete daily activities?: Yes Patient able to express need for assistance with ADLs?: Yes Independently  performs ADLs?: Yes (appropriate for developmental age)  Prior Inpatient Therapy Prior Inpatient Therapy: Yes Prior Therapy Dates: 2019 Prior Therapy Facilty/Provider(s): [facility] Reason for Treatment: polysubstance use, depression  Prior Outpatient Therapy Prior Outpatient Therapy: Yes Prior Therapy Dates: 2019 Prior Therapy Facilty/Provider(s): [facility] Reason for Treatment: bipolar Does patient have an ACCT team?: No Does patient have Intensive In-House Services?  : No Does patient have [facility] services? : No Does patient have P4CC services?: Unknown  ADL Screening (condition at time of admission) Patient's cognitive ability adequate to safely complete daily activities?: Yes Is the patient deaf or have difficulty hearing?: No Does the patient have difficulty seeing, even when wearing glasses/contacts?: No Does the patient have difficulty concentrating, remembering, or making decisions?: No Patient able to express need for assistance with ADLs?: Yes Does the patient have difficulty dressing or bathing?: No Independently performs ADLs?: Yes (appropriate for developmental age) Does the patient have difficulty walking or climbing stairs?: No Weakness of Legs: None Weakness of Arms/Hands: None  Home Assistive Devices/Equipment Home Assistive Devices/Equipment: None  Therapy Consults (therapy consults require a physician order) PT Evaluation Needed: No OT Evalulation Needed: No SLP Evaluation Needed: No Abuse/Neglect Assessment (Assessment to be complete while patient is alone) Abuse/Neglect Assessment Can Be Completed: Yes Physical Abuse: Denies Verbal Abuse: Denies Sexual Abuse: Denies Exploitation of patient/patient's resources: Denies Self-Neglect: Denies Values / Beliefs Cultural Requests During Hospitalization: None Spiritual Requests During Hospitalization: None Consults Spiritual Care Consult Needed: No Social Work Consult Needed: No Merchant navy officerAdvance Directives (For  Healthcare) Does Patient Have a Medical Advance Directive?: No Would patient like information on creating a medical advance directive?: No - Patient declined    Additional Information 1:1 In Past 12 Months?: No CIRT Risk: No Elopement Risk: No  Does patient have medical clearance?: Yes     Disposition:  Disposition Initial Assessment Completed for this Encounter: Yes Disposition of Patient: (Pending) Patient refused recommended treatment: No Mode of transportation if patient is discharged?: Walking Patient referred to: Other (Comment)(Pending disposition)  On Site Evaluation by:   Reviewed with Physician:     Aubery Lapping, MS, [facility] 05/25/2018 5:01 AM

## 2018-05-25 NOTE — ED Notes (Signed)
IVC/Consult completed/ Pending placement 

## 2018-05-25 NOTE — ED Notes (Signed)
Hourly rounding reveals patient sleeping in room. No complaints, stable, in no acute distress. Q15 minute rounds and monitoring via Security Cameras to continue. 

## 2018-05-25 NOTE — ED Notes (Signed)
Per [facility] there is no [facility] transport tonight, call tomorrow morning  6/20 for transport

## 2018-05-25 NOTE — ED Notes (Signed)
Patient resting quietly in room. No noted distress or abnormal behaviors noted. Will continue 15 minute checks and observation by security camera for safety. 

## 2018-05-25 NOTE — Consult Note (Signed)
[facility] Consult   Reason for Consult: Consult for 57 year old man who came voluntarily to the emergency room requesting treatment for substance abuse and depression Referring Physician: Jimmye Norman Patient Identification: Duane Randolph. MRN:  [id number] Principal Diagnosis: Severe major depression, single episode, without psychotic features [facility]) Diagnosis:   Patient Active Problem List   Diagnosis Date Noted  . Cocaine abuse ([place]) [F14.10] 05/25/2018  . Alcohol abuse [F10.10] 05/25/2018  . Severe major depression, single episode, without psychotic features ([place]) [F32.2] 12/08/2017  . Gastric reflux [K21.9] 12/08/2017  . History of substance abuse [Z87.898] 12/08/2017  . Hematemesis [K92.0] 10/09/2016    Total Time spent with patient: 1 hour  Subjective:   Duane Damaso. is a 57 y.o. male patient admitted with "I am suicidal because I am on cocaine".  HPI: Patient seen chart reviewed.  57 year old man presented to the emergency room stating that he was suicidal.  He tells me right off the bat that he is suicidal and homicidal.  He has thoughts about wanting to die and not wanting to "be here" because he is using so much cocaine marijuana and alcohol.  Repeats himself frequently saying that he is just sick of the way that he is living.  He has not been able to sleep well and days or weeks.  He has lost about 40 pounds which appears to actually be true.  He is not currently taking any of his psychiatric medicine and has not followed up with outpatient treatment.  He claims that he hears voices telling him that he should kill himself.  He is currently homeless.  When he does have a place to stay he stays with his wife for long-term girlfriend.  Social history: Vague about where he would stay.  Indicates to me that he currently feels homeless.  Not working.  Medical history: History of ulcers.  Claims to have chronic back pain and tries to get me to give him  Percocet.  Substance abuse history: Long-standing alcohol and drug abuse.  Patient says he is using crack cocaine powder cocaine marijuana and alcohol.  Drug screen supports that although he did not have any alcohol on presentation.  Denies history of seizures or delirium tremens.  He has apparently never really engaged in any kind of substance abuse treatment and is very demanding about being sent to an inpatient treatment center.  Past Psychiatric History: Past history of hospitalization at [facility] in January.  He came here under almost identical circumstances and was referred there.  Patient says he stayed about a week.  He has gone to local mental health agencies and claims to be diagnosed with PTSD and to take Seroquel and Remeron.  No history of actual suicide attempts.  Risk to Self: Suicidal Ideation: Yes-Currently Present Suicidal Intent: No Is patient at risk for suicide?: Yes Suicidal Plan?: No Access to Means: No What has been your use of drugs/alcohol within the last 12 months?: daily use How many times?: 0 Other Self Harm Risks: drug use Triggers for Past Attempts: None known Intentional Self Injurious Behavior: None Risk to Others: Homicidal Ideation: No Thoughts of Harm to Others: No Current Homicidal Intent: No Current Homicidal Plan: No Access to Homicidal Means: No Identified Victim: None History of harm to others?: No Assessment of Violence: None Noted Violent Behavior Description: n/a Does patient have access to weapons?: No Criminal Charges Pending?: No Does patient have a court date: No Prior Inpatient Therapy: Prior Inpatient Therapy: Yes  Prior Therapy Dates: 2019 Prior Therapy Facilty/Provider(s): [facility] Reason for Treatment: polysubstance use, depression Prior Outpatient Therapy: Prior Outpatient Therapy: Yes Prior Therapy Dates: 2019 Prior Therapy Facilty/Provider(s): [facility] Reason for Treatment: bipolar Does patient have an ACCT team?: No Does patient  have Intensive In-House Services?  : No Does patient have [facility] services? : No Does patient have P4CC services?: Unknown  Past Medical History:  Past Medical History:  Diagnosis Date  . Arthritis   . Asthma   . H/O degenerative disc disease     Past Surgical History:  Procedure Laterality Date  . ESOPHAGOGASTRODUODENOSCOPY (EGD) WITH PROPOFOL N/A 10/09/2016   Procedure: ESOPHAGOGASTRODUODENOSCOPY (EGD) WITH PROPOFOL;  Surgeon: Manya Silvas, MD;  Location: [facility] ENDOSCOPY;  Service: Endoscopy;  Laterality: N/A;  . EYE SURGERY     Family History: History reviewed. No pertinent family history. Family Psychiatric  History: Positive for substance abuse Social History:  Social History   Substance and Sexual Activity  Alcohol Use Yes   Comment: 2 40oz beers/day     Social History   Substance and Sexual Activity  Drug Use Yes  . Types: Marijuana, Cocaine   Comment: both last ised 04/11/17    Social History   Socioeconomic History  . Marital status: Single    Spouse name: Not on file  . Number of children: Not on file  . Years of education: Not on file  . Highest education level: Not on file  Occupational History  . Not on file  Social Needs  . Financial resource strain: Not on file  . Food insecurity:    Worry: Not on file    Inability: Not on file  . Transportation needs:    Medical: Not on file    Non-medical: Not on file  Tobacco Use  . Smoking status: Current Every Day Smoker    Packs/day: 1.50    Types: Cigarettes  . Smokeless tobacco: Never Used  Substance and Sexual Activity  . Alcohol use: Yes    Comment: 2 40oz beers/day  . Drug use: Yes    Types: Marijuana, Cocaine    Comment: both last ised 04/11/17  . Sexual activity: Not on file  Lifestyle  . Physical activity:    Days per week: Not on file    Minutes per session: Not on file  . Stress: Not on file  Relationships  . Social connections:    Talks on phone: Not on file    Gets together: Not on  file    Attends religious service: Not on file    Active member of club or organization: Not on file    Attends meetings of clubs or organizations: Not on file    Relationship status: Not on file  Other Topics Concern  . Not on file  Social History Narrative  . Not on file   Additional Social History:    Allergies:   Allergies  Allergen Reactions  . Bee Venom Other (See Comments)    Childhood allergy  . Tomato Swelling    Swollen eyes    Labs:  Results for orders placed or performed during the hospital encounter of 05/24/18 (from the past 48 hour(s))  Urine Drug Screen, Qualitative     Status: Abnormal   Collection Time: 05/24/18  5:58 PM  Result Value Ref Range   Tricyclic, Ur Screen POSITIVE (A) NONE DETECTED   Amphetamines, Ur Screen NONE DETECTED NONE DETECTED   MDMA (Ecstasy)Ur Screen NONE DETECTED NONE DETECTED   Cocaine Metabolite,Ur  [place] POSITIVE (A) NONE DETECTED   Opiate, Ur Screen NONE DETECTED NONE DETECTED   Phencyclidine (PCP) Ur S NONE DETECTED NONE DETECTED   Cannabinoid 50 Ng, Ur [place] POSITIVE (A) NONE DETECTED   Barbiturates, Ur Screen (A) NONE DETECTED    Result not available. Reagent lot number recalled by manufacturer.   Benzodiazepine, Ur Scrn NONE DETECTED NONE DETECTED   Methadone Scn, Ur NONE DETECTED NONE DETECTED    Comment: (NOTE) Tricyclics + metabolites, urine    Cutoff 1000 ng/mL Amphetamines + metabolites, urine  Cutoff 1000 ng/mL MDMA (Ecstasy), urine              Cutoff 500 ng/mL Cocaine Metabolite, urine          Cutoff 300 ng/mL Opiate + metabolites, urine        Cutoff 300 ng/mL Phencyclidine (PCP), urine         Cutoff 25 ng/mL Cannabinoid, urine                 Cutoff 50 ng/mL Barbiturates + metabolites, urine  Cutoff 200 ng/mL Benzodiazepine, urine              Cutoff 200 ng/mL Methadone, urine                   Cutoff 300 ng/mL The urine drug screen provides only a preliminary, unconfirmed analytical test result and should not be  used for non-medical purposes. Clinical consideration and professional judgment should be applied to any positive drug screen result due to possible interfering substances. A more specific alternate chemical method must be used in order to obtain a confirmed analytical result. Gas chromatography / mass spectrometry (GC/MS) is the preferred confirmat ory method. Performed at [facility], [address] [place], [place] [address]   Comprehensive metabolic panel     Status: Abnormal   Collection Time: 05/24/18  5:59 PM  Result Value Ref Range   Sodium 138 135 - 145 mmol/L   Potassium 4.0 3.5 - 5.1 mmol/L   Chloride 107 101 - 111 mmol/L   CO2 23 22 - 32 mmol/L   Glucose, Bld 125 (H) 65 - 99 mg/dL   BUN 16 6 - 20 mg/dL   Creatinine, Ser 1.05 0.61 - 1.24 mg/dL   Calcium 9.1 8.9 - 10.3 mg/dL   Total Protein 6.8 6.5 - 8.1 g/dL   Albumin 4.0 3.5 - 5.0 g/dL   AST 26 15 - 41 U/L   ALT 24 17 - 63 U/L   Alkaline Phosphatase 57 38 - 126 U/L   Total Bilirubin 0.4 0.3 - 1.2 mg/dL   GFR calc non Af Amer >60 >60 mL/min   GFR calc Af Amer >60 >60 mL/min    Comment: (NOTE) The eGFR has been calculated using the CKD EPI equation. This calculation has not been validated in all clinical situations. eGFR's persistently <60 mL/min signify possible Chronic Kidney Disease.    Anion gap 8 5 - 15    Comment: Performed at [facility], [address] [place], [place] [address]  Ethanol     Status: None   Collection Time: 05/24/18  5:59 PM  Result Value Ref Range   Alcohol, Ethyl (B) <10 <10 mg/dL    Comment: (NOTE) Lowest detectable limit for serum alcohol is 10 mg/dL. For medical purposes only. Performed at [facility], [address], [place], [place] [address]   Salicylate level     Status: None  Collection Time: 05/24/18  5:59 PM  Result Value Ref Range   Salicylate Lvl <5.4 2.8 - 30.0 mg/dL    Comment: Performed at [facility], [address] [place], [place] [address]  Acetaminophen level     Status: Abnormal   Collection Time: 05/24/18  5:59 PM  Result Value Ref Range   Acetaminophen (Tylenol), Serum <10 (L) 10 - 30 ug/mL    Comment: (NOTE) Therapeutic concentrations vary significantly. A range of 10-30 ug/mL  may be an effective concentration for many patients. However, some  are best treated at concentrations outside of this range. Acetaminophen concentrations >150 ug/mL at 4 hours after ingestion  and >50 ug/mL at 12 hours after ingestion are often associated with  toxic reactions. Performed at [facility], [address] [place], [place] [address]   cbc     Status: Abnormal   Collection Time: 05/24/18  5:59 PM  Result Value Ref Range   WBC 6.7 3.8 - 10.6 K/uL   RBC 5.44 4.40 - 5.90 MIL/uL   Hemoglobin 15.0 13.0 - 18.0 g/dL   HCT 44.6 40.0 - 52.0 %   MCV 81.9 80.0 - 100.0 fL   MCH 27.7 26.0 - 34.0 pg   MCHC 33.8 32.0 - 36.0 g/dL   RDW 15.1 (H) 11.5 - 14.5 %   Platelets 236 150 - 440 K/uL    Comment: Performed at [facility], [address], [place], [place] [address]    Current Facility-Administered Medications  Medication Dose Route Frequency Provider Last Rate Last Dose  . gabapentin (NEURONTIN) capsule 300 mg  300 mg Oral TID Vestal Crandall T, MD      . mirtazapine (REMERON) tablet 45 mg  45 mg Oral QHS Herold Salguero T, MD      . pantoprazole (PROTONIX) EC tablet 40 mg  40 mg Oral Daily Morene Cecilio T, MD      . QUEtiapine (SEROQUEL) tablet 800 mg  800 mg Oral QHS Mylik Pro, Madie Reno, MD       Current Outpatient Medications  Medication Sig Dispense Refill  . brompheniramine-pseudoephedrine-DM 30-2-10 MG/5ML syrup Take 5 mLs by mouth 4 (four) times daily as needed. (Patient not taking: Reported on 05/24/2018) 120 mL 0  . gabapentin (NEURONTIN) 100 MG capsule Take 100 mg by mouth 3 (three) times daily.    . mirtazapine (REMERON) 45 MG tablet Take 45 mg by mouth at bedtime.    . pantoprazole  (PROTONIX) 40 MG tablet Take 1 tablet (40 mg total) by mouth daily. (Patient not taking: Reported on 05/24/2018) 30 tablet 1  . QUEtiapine (SEROQUEL) 400 MG tablet Take 800 mg by mouth at bedtime.       Musculoskeletal: Strength & Muscle Tone: within normal limits Gait & Station: normal Patient leans: N/A  Psychiatric Specialty Exam: Physical Exam  Nursing note and vitals reviewed. Constitutional: He appears well-developed and well-nourished.  HENT:  Head: Normocephalic and atraumatic.  Eyes: Pupils are equal, round, and reactive to light. Conjunctivae are normal.  Neck: Normal range of motion.  Cardiovascular: Regular rhythm and normal heart sounds.  Respiratory: Effort normal. No respiratory distress.  GI: Soft.  Musculoskeletal: Normal range of motion.  Neurological: He is alert.  Skin: Skin is warm and dry.  Psychiatric: His mood appears anxious. His affect is labile and inappropriate. His speech is rapid and/or pressured. He is agitated. He is not aggressive. Thought content is not paranoid. Cognition and memory are impaired. He expresses impulsivity. He  expresses no homicidal and no suicidal ideation.    Review of Systems  Constitutional: Positive for weight loss. Negative for chills and fever.  HENT: Negative.   Eyes: Negative.   Respiratory: Negative.   Cardiovascular: Negative.   Gastrointestinal: Negative.   Musculoskeletal: Negative.   Skin: Negative.   Neurological: Negative.   Psychiatric/Behavioral: Positive for depression, hallucinations, substance abuse and suicidal ideas. Negative for memory loss. The patient is nervous/anxious and has insomnia.     Blood pressure 115/90, pulse 95, temperature 98.2 F (36.8 C), temperature source Oral, resp. rate 14, height _0  (1.702 m), weight 63.5 kg (140 lb), SpO2 100 %.Body mass index is 21.93 kg/m.  General Appearance: Disheveled  Eye Contact:  Good  Speech:  Pressured  Volume:  Increased  Mood:  Anxious, Depressed  and Dysphoric  Affect:  Inappropriate  Thought Process:  Disorganized  Orientation:  Full (Time, Place, and Person)  Thought Content:  Rumination and Tangential  Suicidal Thoughts:  Yes.  with intent/plan  Homicidal Thoughts:  No  Memory:  Immediate;   Fair Recent;   Fair Remote;   Fair  Judgement:  Poor  Insight:  Shallow  Psychomotor Activity:  Restlessness  Concentration:  Concentration: Poor  Recall:  Poor  Fund of Knowledge:  Fair  Language:  Fair  Akathisia:  No  Handed:  Right  AIMS (if indicated):     Assets:  Desire for Improvement  ADL's:  Intact  Cognition:  WNL  Sleep:        Treatment Plan Summary: Daily contact with patient to assess and evaluate symptoms and progress in treatment, Medication management and Plan 57 year old man claiming to be suicidal and have a wish to die because he is using drugs.  His affect is agitated and almost manic.  Very demanding at times.  When I told him that I could not promise that he would be referred to long-term inpatient substance abuse treatment he began to demand that we refer him to [facility].  Patient is not willing to discuss outpatient options at this point.  He is trying to get me to give him Percocets.  He will be restarted on his Seroquel and Remeron and Protonix.  Case reviewed with TTS and emergency room physician.  Try to refer to outside facility for depression with suicidal thoughts.  Disposition: Recommend psychiatric Inpatient admission when medically cleared. Supportive therapy provided about ongoing stressors.  Duane Berthold, MD 05/25/2018 4:03 PM

## 2018-05-25 NOTE — ED Notes (Signed)
Pt asleep. Breakfast tray placed in rm.  
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
## 2018-05-25 NOTE — Progress Notes (Signed)
[facility] called again stating that they will not be able to accept pt until tomorrow (05/26/18) after 0700 instead of today; [facility] and to [facility] secretary. Pt's nurse, Susy FrizzleMatt RN, and pt's secretary, Carlisle BeersLuann, expressed an understanding.

## 2018-05-25 NOTE — ED Notes (Signed)
Pt irritable and wanting to know why he was not already admitted to the inpatient unit. Pt stated the last time here he was discharged and immediately began using drugs. RN explained he has to be seen by the psychiatrist before the possibility of being admitted. Pt taking a shower.  Maintained on 15 minute checks and observation by security camera for safety.

## 2018-05-25 NOTE — ED Notes (Signed)
Pt agitated, stating he was not happy with the care he was receiving. Pt allowed to use phone and returned to his room.  Maintained on 15 minute checks and observation by security camera for safety.

## 2018-05-25 NOTE — BH Assessment (Signed)
Patient has been accepted to [facility].  [facility] Accept[facility] physician is Dr. Linus Makoayyba Butter.  Call report to [phone].  Representative was Alimah.   ER Staff is aware of it:  [place], ER Sectary  Dr. Derrill KayGoodman, ER MD  Molli HazardMatthew, Patient's Nurse

## 2018-05-25 NOTE — ED Notes (Signed)
Called for transport to [facility]  1955

## 2018-05-25 NOTE — ED Notes (Addendum)
Pt upset with his dinner tray. "The doctor told me I could have double portions because I have been on drugs. You can't understand. You haven't been on drugs."   RN spoke with psychiatrist and was told there is no such order. Pt informed the only option was a sandwich tray. Pt apologized and accepted tray.   Maintained on 15 minute checks and observation by security camera for safety.

## 2018-05-25 NOTE — ED Notes (Signed)
Patient asleep in room. No noted distress or abnormal behavior. Will continue 15 minute checks and observation by security cameras for safety. 

## 2018-05-26 NOTE — ED Notes (Signed)
Pt into shower. Pt states "if they send me home, yes, I will be suicidal" pt states "I don't know how I feel right now, I just want to get going to [facility], get some help, I can't go back to that same situation". Pt previously told this rn he was not suicidal at this time.

## 2018-05-26 NOTE — ED Notes (Signed)
Pt states he came to hospital "with a gold chain and it ain't here". No key noted in pyxis for pt's belongings, nothing charted in chart regarding a gold chain. Pt provided with number for hospital and instructions on how to file a missing belonging. Pt verbalizes understanding.

## 2018-05-26 NOTE — ED Notes (Signed)
Talked with patient about inpatient therapy.  Pt. States he has had inpatient services in the past for substance abuse.  Pt. States he has been to [facility] in the past and would like to go back.  Pt. Informed he was accepted and would be leaving tomorrow.

## 2018-05-26 NOTE — ED Notes (Signed)
Report from matt, rn.  

## 2018-05-26 NOTE — ED Provider Notes (Signed)
-----------------------------------------   9:36 AM on 05/26/2018 -----------------------------------------   Blood pressure (!) 147/80, pulse 70, temperature 98.1 F (36.7 C), temperature source Oral, resp. rate 18, height 5\' 7"  (1.702 m), weight 63.5 kg (140 lb), SpO2 98 %.  The patient had no acute events since last update.  Calm and cooperative at this time.  Disposition is pending per Psychiatry/Behavioral Medicine team recommendations.     Nita SickleVeronese, [place], MD 05/26/18 [phone]

## 2018-05-26 NOTE — ED Notes (Addendum)
Spoke with ed secretary annette who states pt is "on the list" for transport. Continuing to await notification from sherriff department for transport to [facility].

## 2018-05-31 ENCOUNTER — Telehealth: Payer: Self-pay | Admitting: Emergency Medicine

## 2018-05-31 NOTE — Telephone Encounter (Signed)
Pt calling in reference to his "$1200 rope gold chain".  No documentation found in the chart of pt belongings.  Tech Felicia recalls the RN looking through belonging bag prior to pt leaving to go to [facility].

## 2018-07-21 ENCOUNTER — Other Ambulatory Visit: Payer: Self-pay

## 2018-07-21 ENCOUNTER — Emergency Department: Payer: Medicaid Other

## 2018-07-21 ENCOUNTER — Emergency Department
Admission: EM | Admit: 2018-07-21 | Discharge: 2018-07-21 | Disposition: A | Discharge status: Home / Self Care | Payer: Medicaid Other | Attending: Emergency Medicine | Admitting: Emergency Medicine

## 2018-07-21 DIAGNOSIS — M79604 Pain in right leg: Secondary | ICD-10-CM | POA: Diagnosis not present

## 2018-07-21 DIAGNOSIS — F17210 Nicotine dependence, cigarettes, uncomplicated: Secondary | ICD-10-CM | POA: Diagnosis not present

## 2018-07-21 DIAGNOSIS — Z79899 Other long term (current) drug therapy: Secondary | ICD-10-CM | POA: Insufficient documentation

## 2018-07-21 DIAGNOSIS — R0981 Nasal congestion: Secondary | ICD-10-CM | POA: Diagnosis present

## 2018-07-21 DIAGNOSIS — J40 Bronchitis, not specified as acute or chronic: Secondary | ICD-10-CM | POA: Diagnosis not present

## 2018-07-21 LAB — GROUP A STREP BY PCR: Group A Strep by PCR: NOT DETECTED

## 2018-07-21 MED ORDER — ALBUTEROL SULFATE HFA 108 (90 BASE) MCG/ACT IN AERS: 2.0000 | INHALATION_SPRAY | Freq: Four times a day (QID) | RESPIRATORY_TRACT | 0 refills | Status: AC | PRN

## 2018-07-21 MED ORDER — AZITHROMYCIN 250 MG PO TABS: ORAL_TABLET | ORAL | 0 refills | Status: AC

## 2018-07-21 MED ORDER — BENZONATATE 100 MG PO CAPS: ORAL_CAPSULE | ORAL | 0 refills | Status: AC

## 2018-07-21 MED ORDER — IPRATROPIUM-ALBUTEROL 0.5-2.5 (3) MG/3ML IN SOLN
3.0000 mL | Freq: Once | RESPIRATORY_TRACT | Status: AC
  Administered 2018-07-21: 3 mL via RESPIRATORY_TRACT
  Filled 2018-07-21: qty 3

## 2018-07-21 MED ORDER — PREDNISONE 10 MG PO TABS: 10.0000 mg | ORAL_TABLET | Freq: Two times a day (BID) | ORAL | 0 refills | Status: AC

## 2018-07-21 MED ORDER — PREDNISONE 20 MG PO TABS
60.0000 mg | ORAL_TABLET | Freq: Once | ORAL | Status: AC
  Administered 2018-07-21: 60 mg via ORAL
  Filled 2018-07-21: qty 3

## 2018-07-21 NOTE — Discharge Instructions (Addendum)
Your exam, chest x-ray, and leg ultrasound are all negative for any infection or blood clots. Take the antibiotic and steroid as directed. Use the inhaler as needed for chest tightness. Take the cough medicine as needed. Follow[facility] with [facility] for continued symptoms.

## 2018-07-21 NOTE — ED Notes (Signed)
See triage note  Presents with cough,"chills' body aches and sore throat  States sx's started couple of days ago  Afebrile on arrival  occasional cough noted

## 2018-07-21 NOTE — ED Triage Notes (Addendum)
Pt to ED via POV. C/o of right leg pain, pt states feels tight, full ROM. No recent long travels. Pt c/o of cough and congestion, states coughing up black stuff and bright red blood in sputum x1week smokes 1.5 packs per day. Pt took nyquil with no relief. VSS.

## 2018-07-22 NOTE — ED Provider Notes (Signed)
[facility] Emergency Department Provider Note ____________________________________________  Time seen: 1427  I have reviewed the triage vital signs and the nursing notes.  HISTORY  Chief Complaint  Nasal Congestion and Leg Pain  HPI Duane ClauseColonel R Lewallen Jr. is a 57 y.o. male presents to the ED for evaluation of cough, congestion, and right leg pain.  Patient describes cough and congestion for the last 2 to 3 days.  It is to be an everyday smoker but denies any intermittent fevers, chills, hemoptysis, or cough induced vomiting.  He has had a history of GI bleed, but denies any hematochezia or dark tarry stools, he also denies any bright red blood per rectum.  He has had some phlegm production that he describes as black sputum.  He is a current everyday smoker of tobacco and admits to alcohol intake.  He also has an unrelated complaint of some intermittent right leg pain.  Patient describes pain in the posterior right hip and buttocks, down to the calf.  He denies any recent injury, accident, or trauma.  He also denies any foot drop, leg weakness, or saddle anesthesia.  Patient does report some history of lumbar DDD.  Past Medical History:  Diagnosis Date  . Arthritis   . Asthma   . H/O degenerative disc disease     Patient Active Problem List   Diagnosis Date Noted  . Cocaine abuse (HCC) 05/25/2018  . Alcohol abuse 05/25/2018  . Severe major depression, single episode, without psychotic features (HCC) 12/08/2017  . Gastric reflux 12/08/2017  . History of substance abuse 12/08/2017  . Hematemesis 10/09/2016    Past Surgical History:  Procedure Laterality Date  . ESOPHAGOGASTRODUODENOSCOPY (EGD) WITH PROPOFOL N/A 10/09/2016   Procedure: ESOPHAGOGASTRODUODENOSCOPY (EGD) WITH PROPOFOL;  Surgeon: Scot Junobert T Elliott, MD;  [facility];  Service: Endoscopy;  Laterality: N/A;  . EYE SURGERY      Prior to Admission medications   Medication Sig Start Date End  Date Taking? Authorizing Provider  albuterol (PROVENTIL HFA;VENTOLIN HFA) 108 (90 Base) MCG/ACT inhaler Inhale 2 puffs into the lungs every 6 (six) hours as needed for wheezing or shortness of breath. 07/21/18   Kimika Streater, Charlesetta IvoryJenise V Bacon, PA-C  azithromycin (ZITHROMAX Z-PAK) 250 MG tablet Take 2 tablets (500 mg) on  Day 1,  followed by 1 tablet (250 mg) once daily on Days 2 through 5. 07/21/18   Greg Cratty, Charlesetta IvoryJenise V Bacon, PA-C  benzonatate (TESSALON PERLES) 100 MG capsule Take 1-2 tabs TID prn cough 07/21/18   Natalio Salois, Charlesetta IvoryJenise V Bacon, PA-C  brompheniramine-pseudoephedrine-DM 30-2-10 MG/5ML syrup Take 5 mLs by mouth 4 (four) times daily as needed. Patient not taking: Reported on 05/24/2018 01/06/18   Tommi RumpsSummers, Rhonda L, PA-C  gabapentin (NEURONTIN) 100 MG capsule Take 100 mg by mouth 3 (three) times daily.    [provider]  mirtazapine (REMERON) 45 MG tablet Take 45 mg by mouth at bedtime.    [provider]  pantoprazole (PROTONIX) 40 MG tablet Take 1 tablet (40 mg total) by mouth daily. Patient not taking: Reported on 05/24/2018 04/12/18 04/12/19  Jeanmarie PlantMcShane, James A, MD  predniSONE (DELTASONE) 10 MG tablet Take 1 tablet (10 mg total) by mouth 2 (two) times daily with a meal. 07/21/18   Kellene Mccleary, Charlesetta IvoryJenise V Bacon, PA-C  QUEtiapine (SEROQUEL) 400 MG tablet Take 800 mg by mouth at bedtime.     [provider]    Allergies Bee venom and Tomato  History reviewed. No pertinent family history.  Social  History Social History   Tobacco Use  . Smoking status: Current Every Day Smoker    Packs/day: 1.50    Types: Cigarettes  . Smokeless tobacco: Never Used  Substance Use Topics  . Alcohol use: Yes    Comment: 2 40oz beers/day  . Drug use: Yes    Types: Marijuana    Comment: both last ised 04/11/17    Review of Systems  Constitutional: Negative for fever. Eyes: Negative for visual changes. ENT: Negative for sore throat. Cardiovascular: Negative for chest pain. Respiratory:  Negative for shortness of breath.  Reports cough and congestion. Gastrointestinal: Negative for abdominal pain, vomiting and diarrhea. Genitourinary: Negative for dysuria. Musculoskeletal: Negative for back pain.  Reports right leg pain as above. Skin: Negative for rash. Neurological: Negative for headaches, focal weakness or numbness. ____________________________________________  PHYSICAL EXAM:  VITAL SIGNS: ED Triage Vitals  Enc Vitals Group     BP 07/21/18 1323 115/75     Pulse Rate 07/21/18 1323 81     Resp --      Temp 07/21/18 1323 98.5 F (36.9 C)     Temp Source 07/21/18 1323 Oral     SpO2 07/21/18 1323 97 %     Weight 07/21/18 1324 170 lb (77.1 kg)     Height 07/21/18 1324 5\' 7"  (1.702 m)     Head Circumference --      Peak Flow --      Pain Score 07/21/18 1324 10     Pain Loc --      Pain Edu? --      Excl. in GC? --     Constitutional: Alert and oriented. Well appearing and in no distress. Head: Normocephalic and atraumatic. Eyes: Conjunctivae are normal. PERRL. Normal extraocular movements Ears: Canals clear. TMs intact bilaterally. Nose: No congestion/rhinorrhea/epistaxis. Mouth/Throat: Mucous membranes are moist.  Uvula is midline and tonsils are flat.  No oropharyngeal lesions are appreciated. Neck: Supple. No thyromegaly. Hematological/Lymphatic/Immunological: No cervical lymphadenopathy. Cardiovascular: Normal rate, regular rhythm. Normal distal pulses. Respiratory: Normal respiratory effort. No wheezes/rales.  Harsh rhonchi noted bilaterally on deep expiration. Gastrointestinal: Soft and nontender. No distention. Musculoskeletal: Right leg without any obvious deformity, dislocation, or effusion.  Patient with generalized muscular tenderness to the posterior hip, thigh, and calf.  Normal knee exam is appreciated without signs of internal derangement.  Patient is able to demonstrate normal hip flexion and extension on exam.  Nontender with normal range of  motion in all extremities.  Neurologic: Cranial nerves II through XII grossly intact.  Normal LE DTRs bilaterally.  Normal toe and heel raise on exam.  Normal gait without ataxia. Normal speech and language. No gross focal neurologic deficits are appreciated. Skin:  Skin is warm, dry and intact. No rash noted. Psychiatric: Mood and affect are normal. Patient exhibits appropriate insight and judgment. ____________________________________________   LABS (pertinent positives/negatives)  Labs Reviewed  GROUP A STREP BY PCR  ___________________________________________   RADIOLOGY  CXR  IMPRESSION: Negative chest.  RLE Venous Doppler US  IMPRESSION: No evidence of DVT within the right lower extremity. ____________________________________________  PROCEDURES  Procedures Prednisone 60 mg PO DuoNeb x 1  ____________________________________________  INITIAL IMPRESSION / ASSESSMENT AND PLAN / ED COURSE  Patient with ED evaluation of 2 separate complaints.  He is reporting some cough and congestion for 2 to 3 days.  He is also noting some intermittent right leg pain for several weeks.  His exam is overall benign from the standpoint of his right leg  complaint.  He is reassured by his negative DVT study.  Symptoms may represent some sciatic nerve irritation given his history of degenerative disc disease.  In regards to his pulmonary complaint his chest x-ray is negative for any acute infectious process.  Patient has improved substantially following the DuoNeb treatment, and has clear breath sounds throughout.  He will be treated empirically for bronchitis, and discharged with a prescription for an albuterol inhaler, azithromycin, Tessalon Perles, and prednisone.  [facility] for ongoing symptom management.  Return precautions have been reviewed. ____________________________________________  FINAL CLINICAL IMPRESSION(S) / ED DIAGNOSES  Final diagnoses:   Bronchitis  Right leg pain      Karmen StabsMenshew, Charlesetta IvoryJenise V Bacon, PA-C 07/22/18 1721    Arnaldo NatalMalinda, Paul F, MD 07/24/18 [phone]

## 2019-07-17 ENCOUNTER — Other Ambulatory Visit: Payer: Self-pay | Admitting: Student

## 2019-07-17 DIAGNOSIS — M5416 Radiculopathy, lumbar region: Secondary | ICD-10-CM

## 2019-07-17 DIAGNOSIS — M5441 Lumbago with sciatica, right side: Secondary | ICD-10-CM

## 2019-07-17 DIAGNOSIS — M51369 Other intervertebral disc degeneration, lumbar region without mention of lumbar back pain or lower extremity pain: Secondary | ICD-10-CM

## 2019-07-17 DIAGNOSIS — G8929 Other chronic pain: Secondary | ICD-10-CM

## 2019-07-17 DIAGNOSIS — M5136 Other intervertebral disc degeneration, lumbar region: Secondary | ICD-10-CM

## 2019-07-20 ENCOUNTER — Ambulatory Visit: Payer: Medicaid Other

## 2019-08-01 ENCOUNTER — Ambulatory Visit: Admission: RE | Admit: 2019-08-01 | Payer: Medicaid Other | Source: Ambulatory Visit

## 2019-08-17 ENCOUNTER — Ambulatory Visit: Payer: Medicaid Other

## 2019-08-29 ENCOUNTER — Ambulatory Visit: Admission: RE | Admit: 2019-08-29 | Payer: Medicaid Other | Source: Ambulatory Visit

## 2019-09-22 ENCOUNTER — Ambulatory Visit
Admission: RE | Admit: 2019-09-22 | Discharge: 2019-09-22 | Disposition: A | Discharge status: Home / Self Care | Payer: Medicaid Other | Source: Ambulatory Visit | Attending: Student | Admitting: Student

## 2019-09-22 ENCOUNTER — Other Ambulatory Visit: Payer: Self-pay

## 2019-09-22 DIAGNOSIS — M5416 Radiculopathy, lumbar region: Secondary | ICD-10-CM | POA: Diagnosis present

## 2019-09-22 DIAGNOSIS — G8929 Other chronic pain: Secondary | ICD-10-CM | POA: Diagnosis present

## 2019-09-22 DIAGNOSIS — M5442 Lumbago with sciatica, left side: Secondary | ICD-10-CM | POA: Diagnosis present

## 2019-09-22 DIAGNOSIS — M5136 Other intervertebral disc degeneration, lumbar region: Secondary | ICD-10-CM | POA: Insufficient documentation

## 2019-09-22 DIAGNOSIS — M5441 Lumbago with sciatica, right side: Secondary | ICD-10-CM | POA: Diagnosis present

## 2020-08-02 IMAGING — MR MR LUMBAR SPINE W/O CM
5 series · 31 of 48 positions shown · non-contrast
Comparison: 08/25/2013

CLINICAL DATA: Low back pain radiating into the buttocks and both
legs with numbness and tingling. Symptoms over the last 5-6 months.

EXAM:
MRI LUMBAR SPINE WITHOUT CONTRAST
TECHNIQUE: Multiplanar, multisequence MR imaging of the lumbar spine was
performed. No intravenous contrast was administered.

[Series 5: T2 · sagittal · 4.0mm · 0.81mm/px · 6 of 16 slices shown (1 of 2)]
[im 1/16]
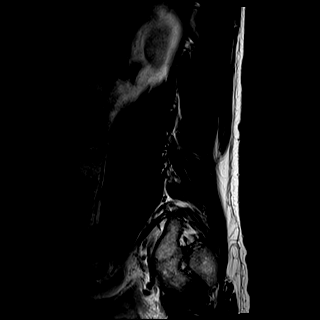
[im 4/16]
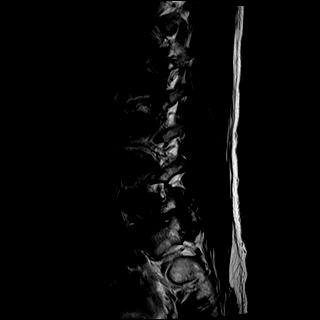
[im 7/16]
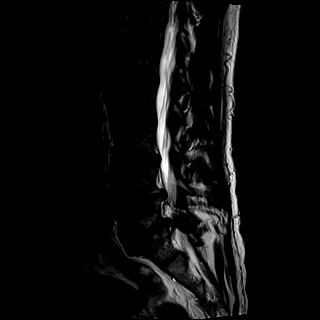
[im 10/16]
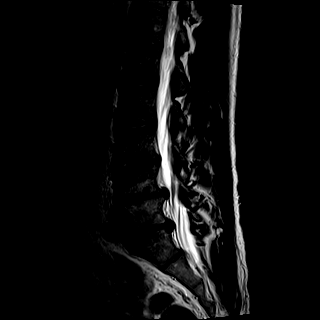
[im 13/16]
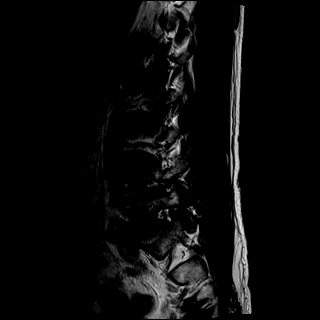
[im 16/16]
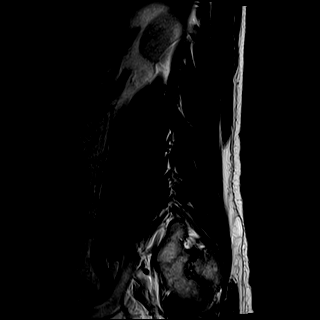

[Series 6: T1 · sagittal · 4.0mm · 0.81mm/px · 6 of 16 slices shown (1 of 2)]
[im 1/16]
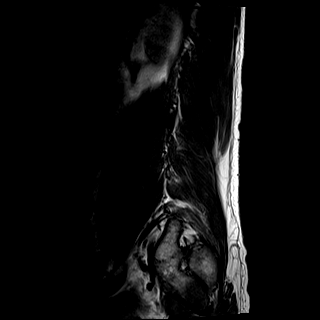
[im 4/16]
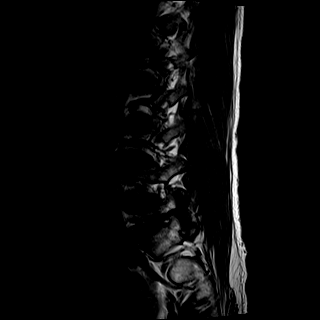
[im 7/16]
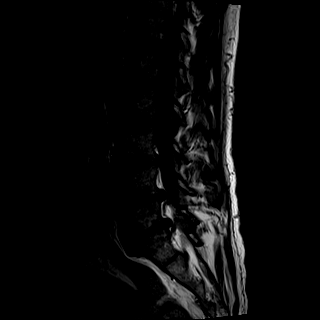
[im 10/16]
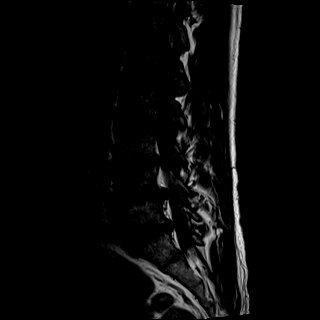
[im 13/16]
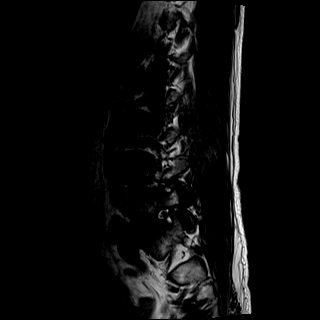
[im 16/16]
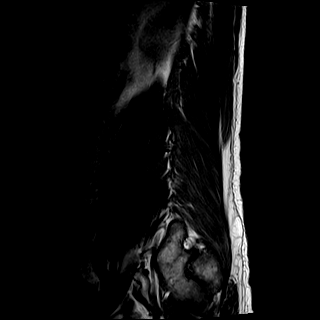

[Series 7: STIR · sagittal · 4.0mm · 0.41mm/px · 1 of 16 slices shown]
[im 1/16]
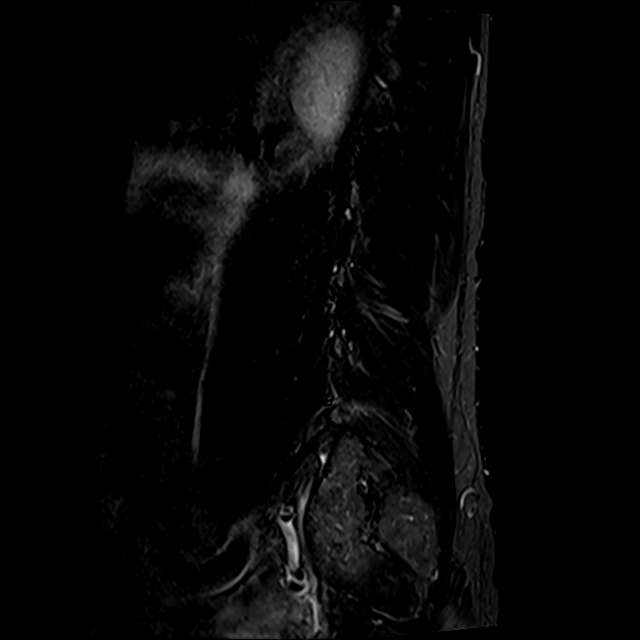

[Series 8: T2 · axial · 4.0mm · 0.78mm/px · z∈[-57,+160]mm · 9 of 37 slices shown (2 of 2)]
[im 1/37]
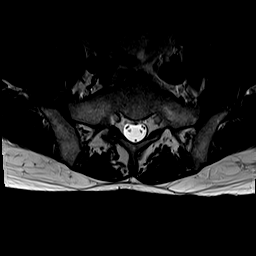
[im 6/37]
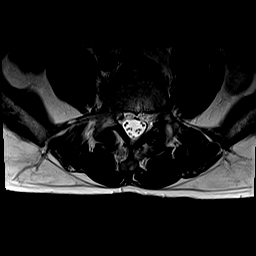
[im 11/37]
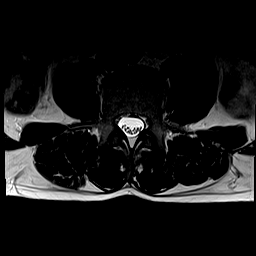
[im 16/37]
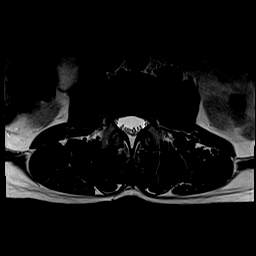
[im 19/37]
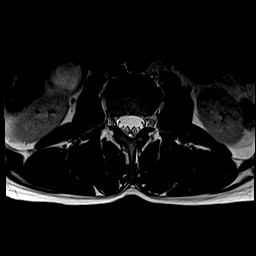
[im 21/37]
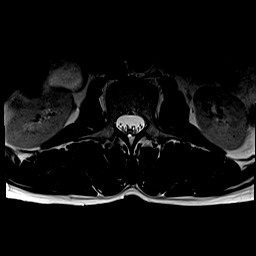
[im 26/37]
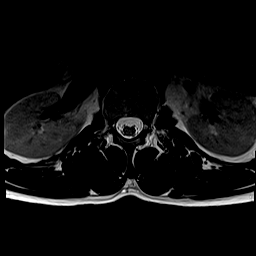
[im 31/37]
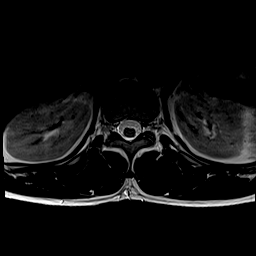
[im 37/37]
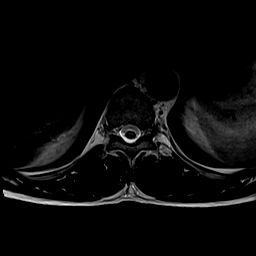

[Series 9: T1 · axial · 4.0mm · 0.39mm/px · z∈[-57,+160]mm · 9 of 37 slices shown (2 of 2)]
[im 1/37]
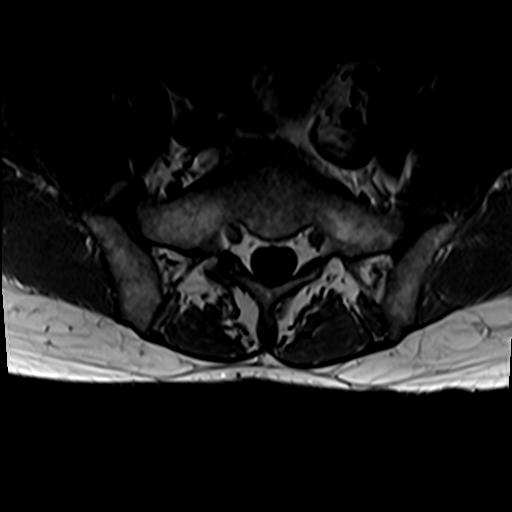
[im 6/37]
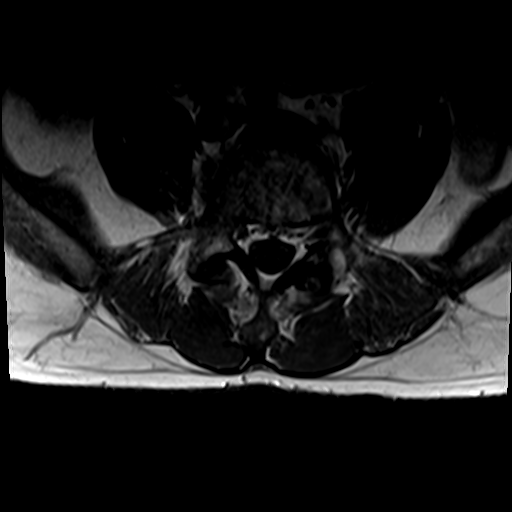
[im 11/37]
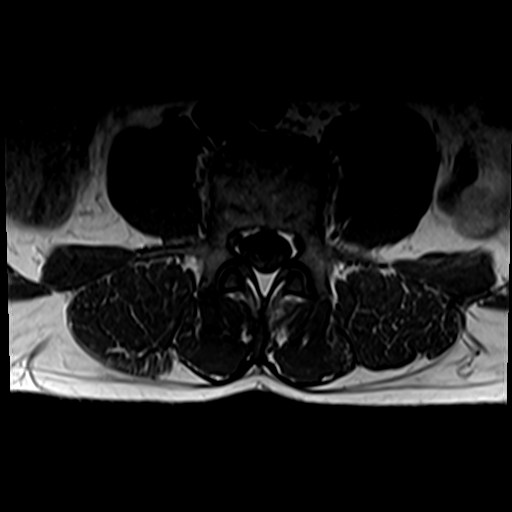
[im 16/37]
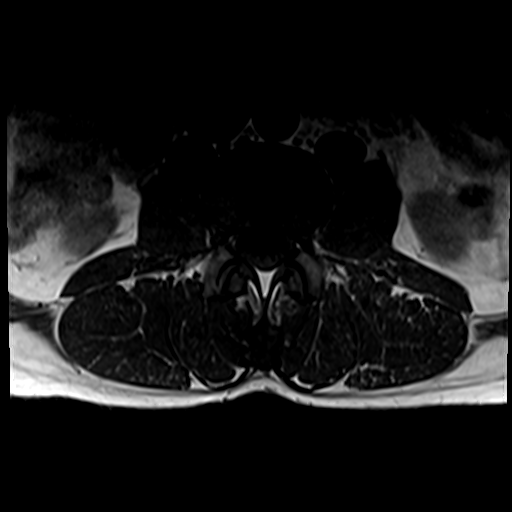
[im 19/37]
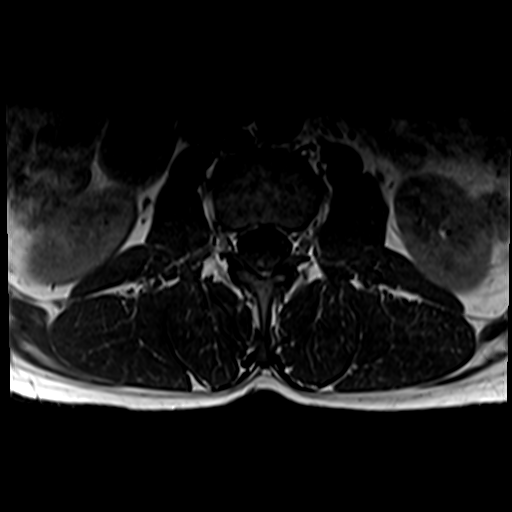
[im 21/37]
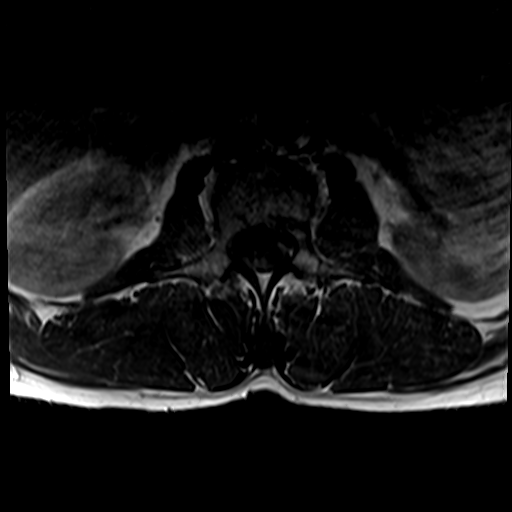
[im 26/37]
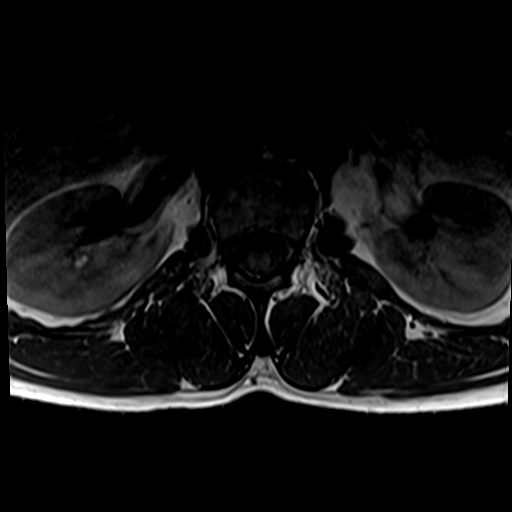
[im 31/37]
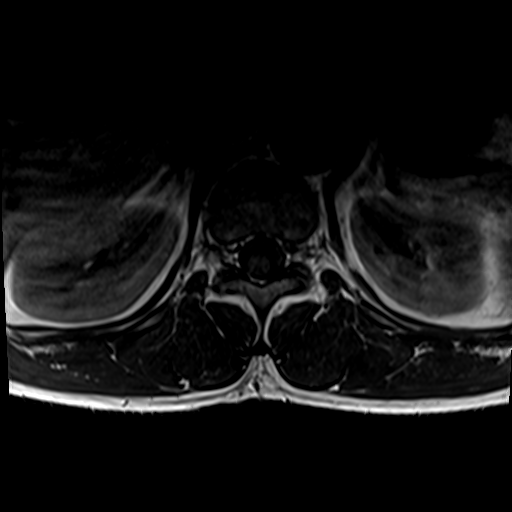
[im 37/37]
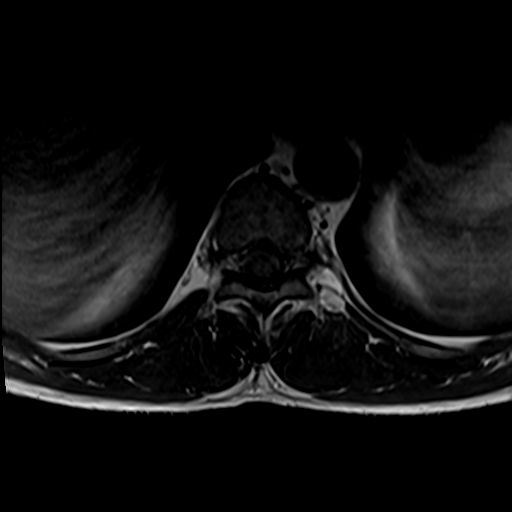

[31 of 48 positions shown; findings below may reference images not displayed]

FINDINGS: Segmentation:  5 lumbar type vertebral bodies.

Alignment:  Normal

Vertebrae: No fracture or primary bone lesion. Discogenic endplate
marrow changes in the lower lumbar spine, with mild edema at L4-5.

Conus medullaris and cauda equina: Conus extends to the L2 level.
Conus and cauda equina appear normal.

Paraspinal and other soft tissues: Negative

Disc levels:

No abnormality at L2-3 or above.

L3-4: Disc degeneration with loss of disc height. Endplate
osteophytes and bulging of the disc. Slight indentation of the
thecal sac but no compressive stenosis.

L4-5: Disc degeneration with loss of disc height. Discogenic
endplate marrow changes as noted above which could contribute to
back pain. Endplate osteophytes and bulging of the disc more
prominent towards the left. Facet degeneration hypertrophy more
prominent on the left. Left lateral recess and foraminal stenosis
that could affect the left L4 and L5 nerve roots.

L5-S1: Disc degeneration with endplate osteophytes and bulging of
the disc more prominent towards the right. Facet degeneration worse
on the right. Stenosis of the right lateral recess and
intervertebral foramen on the right that could affect the right L5
and S1 nerves.

Compared to the study of 9888, findings have worsened slightly at
L4-5 and L5-S1.
IMPRESSION: Worsening lower lumbar degenerative spondylosis which could
contribute to back pain. At L4-5, there is left lateral recess and
foraminal stenosis that could cause left L4 and L5 nerve
compression. At L5-S1, there is right lateral recess and foraminal
stenosis that could cause right L5 and S1 nerve compression.
